# Patient Record
Sex: Female | Born: 1995 | Hispanic: Yes | Marital: Single | Smoking: Never smoker
Health system: Southern US, Community
[De-identification: ages and names within clinical notes are randomized; demographics above are authoritative.]

## PROBLEM LIST (undated history)

## (undated) ENCOUNTER — Inpatient Hospital Stay (HOSPITAL_COMMUNITY): Payer: Medicaid Other

## (undated) DIAGNOSIS — N6311 Unspecified lump in the right breast, upper outer quadrant: Secondary | ICD-10-CM

## (undated) DIAGNOSIS — N644 Mastodynia: Secondary | ICD-10-CM

## (undated) DIAGNOSIS — D649 Anemia, unspecified: Secondary | ICD-10-CM

## (undated) DIAGNOSIS — Z8619 Personal history of other infectious and parasitic diseases: Secondary | ICD-10-CM

## (undated) HISTORY — DX: Personal history of other infectious and parasitic diseases: Z86.19

---
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
## 2015-02-04 ENCOUNTER — Ambulatory Visit (INDEPENDENT_AMBULATORY_CARE_PROVIDER_SITE_OTHER): Payer: Medicaid Other | Admitting: Obstetrics & Gynecology

## 2015-02-10 ENCOUNTER — Ambulatory Visit (INDEPENDENT_AMBULATORY_CARE_PROVIDER_SITE_OTHER): Payer: Medicaid Other | Admitting: Obstetrics & Gynecology

## 2015-03-20 ENCOUNTER — Ambulatory Visit (INDEPENDENT_AMBULATORY_CARE_PROVIDER_SITE_OTHER): Payer: Medicaid Other | Admitting: Obstetrics & Gynecology

## 2015-03-20 ENCOUNTER — Encounter (INDEPENDENT_AMBULATORY_CARE_PROVIDER_SITE_OTHER): Payer: Self-pay | Admitting: Obstetrics & Gynecology

## 2015-03-20 VITALS — BP 122/73 | HR 93 | Resp 18 | Wt 146.8 lb

## 2015-03-20 DIAGNOSIS — Z309 Encounter for contraceptive management, unspecified: Secondary | ICD-10-CM | POA: Insufficient documentation

## 2015-03-20 DIAGNOSIS — N898 Other specified noninflammatory disorders of vagina: Secondary | ICD-10-CM | POA: Insufficient documentation

## 2015-03-20 DIAGNOSIS — Z3002 Counseling and instruction in natural family planning to avoid pregnancy: Secondary | ICD-10-CM

## 2015-03-20 LAB — POCT PREGNANCY TEST, URINE HCG: POCT Pregnancy HCG Test, UR: NEGATIVE

## 2015-03-20 NOTE — Progress Notes (Signed)
Subjective:       Sandra Burgess is a 19 y.o. female who presents for a postpartum visit. She is 8 months postpartum following a spontaneous vaginal delivery. I have fully reviewed the prenatal and intrapartum course. The delivery was at 39 gestational weeks. Outcome: spontaneous vaginal delivery. Anesthesia: epidural. Postpartum course has been normal. Baby's course has been normal. Baby is feeding by bottle - Enfamil AR. Bleeding no bleeding. Bowel function is normal. Bladder function is normal. Patient is not sexually active. Contraception method is abstinence. Postpartum depression screening: negative.    The following portions of the patient's history were reviewed and updated as appropriate: allergies, current medications, past family history, past medical history, past social history, past surgical history and problem list.    Review of Systems  A comprehensive review of systems was negative.     Objective:      BP 122/73 mmHg  Pulse 93  Resp 18  Wt 146 lb 12.8 oz (66.588 kg)  LMP 02/27/2015 (Exact Date)   General:  alert, appears stated age and cooperative    Breasts:  inspection negative, no nipple discharge or bleeding, no masses or nodularity palpable   Lungs: clear to auscultation bilaterally and normal percussion bilaterally   Heart:  regular rate and rhythm, S1, S2 normal, no murmur, click, rub or gallop and normal apical impulse   Abdomen: soft, non-tender; bowel sounds normal; no masses,  no organomegaly    Vulva:  normal   Vagina: normal vagina, no discharge, exudate, lesion, or erythema   Cervix:  no lesions   Corpus: normal size, contour, position, consistency, mobility, non-tender   Adnexa:  no mass, fullness, tenderness   Rectal Exam: Normal rectovaginal exam          Assessment:       8 months postpartum exam. deisres contraception counsel    Plan:   POSTPARTUM EXAM   1. Contraception: abstinence  2. No signs of postpartum depression, no hi no si  3. Follow up in: 3 weeks or as needed.       CONTRACPETION-- counseled over 20 m on all methods r/b/a discussed. Side effects  Pt desires LARC  Will rtc on mense  Probable nexplanon insertion    VAGINAL DISCHARGe-- sure swab done  Await results    Baruch Goldmann MD, Evern Core

## 2015-03-23 LAB — CT, NG, TRICH VAG BY NAA
CHLAMYDIA TRACHOMATIS, NAA: NEGATIVE
Gonococcus by Nucleic acid Amp: NEGATIVE
TRICH VAG BY NAA: NEGATIVE

## 2015-03-27 LAB — VAGINITIS/VAGINOSIS, DNA PROBE

## 2015-04-03 ENCOUNTER — Ambulatory Visit (INDEPENDENT_AMBULATORY_CARE_PROVIDER_SITE_OTHER): Payer: Medicaid Other | Admitting: Obstetrics & Gynecology

## 2015-04-14 ENCOUNTER — Encounter (INDEPENDENT_AMBULATORY_CARE_PROVIDER_SITE_OTHER): Payer: Self-pay | Admitting: Obstetrics & Gynecology

## 2015-04-14 ENCOUNTER — Ambulatory Visit (INDEPENDENT_AMBULATORY_CARE_PROVIDER_SITE_OTHER): Payer: Medicaid Other | Admitting: Obstetrics & Gynecology

## 2015-04-14 VITALS — BP 106/72 | HR 66 | Wt 145.6 lb

## 2015-04-14 DIAGNOSIS — Z30017 Encounter for initial prescription of implantable subdermal contraceptive: Secondary | ICD-10-CM

## 2015-04-14 DIAGNOSIS — Z308 Encounter for other contraceptive management: Secondary | ICD-10-CM

## 2015-04-14 NOTE — Progress Notes (Signed)
Nexplanon Insertion Procedure Note    Pre-operative Diagnosis: undesired fertility    Post-operative Diagnosis: same    Indications: contraception    Procedure Details   Urine pregnancy test was done 8/9 and result was neg.  The risks (including infection, bleeding, pain, and uterine perforation) and benefits of the procedure were explained to the patient and Written informed consent was obtained.      Left arm cleansed with Betadine.  1cc lidocaine given.  Nexplanon inserted without difficulty. Pressure for hemostasis. Patient tolerated procedure well.    Information:  nexplanon.    Condition:  Stable    Complications:  None    Plan:    The patient was advised to call for any fever or for prolonged or severe pain or bleeding. She was advised to use NSAID as needed for mild to moderate pain.     Attending Physician Documentation:  I was present for or participated in the entire procedure, including opening and closing.  Pt to rtc in 4 weeks  Card given  All side effects discussed    Jerene Canny MD, Evern Core

## 2015-04-23 ENCOUNTER — Encounter (INDEPENDENT_AMBULATORY_CARE_PROVIDER_SITE_OTHER): Payer: Self-pay

## 2015-05-12 ENCOUNTER — Ambulatory Visit (INDEPENDENT_AMBULATORY_CARE_PROVIDER_SITE_OTHER): Payer: Medicaid Other | Admitting: Obstetrics & Gynecology

## 2018-09-05 NOTE — L&D Delivery Note (Signed)
Obstetrical Delivery Note   Date of Delivery:   06/09/2019 Primary OB:   [facility] Gestational Age/EDD: [redacted]w[redacted]d Reason for Admission: Bulging membranes, cervical insufficiency Antepartum complications: none  Delivered By:   Durene Romans MD  Delivery Type:   spontaneous vaginal delivery  Delivery Details:   I went to assess Corrissa and BBOW out the introitus with previously established IUFD. Patient bore down and easily delivered fetus (morphologically normal appearing) en caul (normal appearing fluid). Anesthesia:    IV Intrapartum complications: patient presented to [facility] triage with sudden onset pelvic pressure and spotting and saw BOW Laceration:    none Episiotomy:    none Rectal exam:   deferred Placenta:    Retained. To OR for D&C (pt not bleeding) Estimated Blood Loss:  55mL  Baby:    Stillborn fetus, APGARs 0/0, weight appears appropriate. Morphologically normal fetus.   Durene Romans MD Attending [facility])

## 2019-02-12 ENCOUNTER — Ambulatory Visit (INDEPENDENT_AMBULATORY_CARE_PROVIDER_SITE_OTHER): Payer: Medicaid Other | Admitting: Obstetrics & Gynecology

## 2019-02-12 ENCOUNTER — Encounter: Payer: Self-pay | Admitting: *Deleted

## 2019-02-12 ENCOUNTER — Other Ambulatory Visit: Payer: Self-pay

## 2019-02-12 ENCOUNTER — Encounter: Payer: Self-pay | Admitting: Obstetrics & Gynecology

## 2019-02-12 VITALS — BP 121/71 | HR 87 | Ht 59.0 in | Wt 182.0 lb

## 2019-02-12 DIAGNOSIS — N63 Unspecified lump in unspecified breast: Secondary | ICD-10-CM

## 2019-02-12 DIAGNOSIS — Z3046 Encounter for surveillance of implantable subdermal contraceptive: Secondary | ICD-10-CM | POA: Diagnosis not present

## 2019-02-12 NOTE — Progress Notes (Signed)
Pt would like to get nexplanon removed and also has lump on right breast

## 2019-02-12 NOTE — Progress Notes (Signed)
   Subjective:    Patient ID: Theresa Fletcher, female    DOB: 10/29/1995, 23 y.o.   MRN: [id number]  HPI  23 yo single P19 (23 yo) here to have her 23 year old Nexplanon removed. She also would like a breast exam since she found a lump in her right breast about a week ago. It seems to be getting bigger. She has not found anything to make it better or worse.  She is planning to use condoms.  Review of Systems     Objective:   Physical Exam Breathing, conversing, and ambulating normally Well nourished, well hydrated Latina, no apparent distress  Normal breast exam on the left. I noted a 2 cm firm round lump in the right breast, upper outer quadrant about 2cm away from the nipple. No skin changes or nipple discharge. Consent was signed and time out was done. Her left arm was prepped with betadine after establishing the position of the Nexplanon. The area was infiltrated with 2 cc of 1% lidocaine. A small incision was made and the intact rod was easily removed and noted to be intact.  A steristrip was placed and her arm was noted to be hemostatic. It was bandaged.  She tolerated the procedure well.     Assessment & Plan:  Contraception- condoms prn Rec start MVI daily Come back 2 weeks after stopping caffeine If breast lump still there, then will need mammo and u/s

## 2019-02-25 ENCOUNTER — Ambulatory Visit (INDEPENDENT_AMBULATORY_CARE_PROVIDER_SITE_OTHER): Payer: Self-pay | Admitting: Obstetrics & Gynecology

## 2019-02-25 ENCOUNTER — Other Ambulatory Visit: Payer: Self-pay

## 2019-02-25 VITALS — BP 115/72 | HR 79 | Ht 59.0 in | Wt 185.0 lb

## 2019-02-25 DIAGNOSIS — N63 Unspecified lump in unspecified breast: Secondary | ICD-10-CM

## 2019-02-25 NOTE — Progress Notes (Signed)
Lump right breast.  Tender to touch.

## 2019-02-25 NOTE — Progress Notes (Signed)
   Subjective:    Patient ID: Theresa Fletcher, female    DOB: Apr 13, 1996, 23 y.o.   MRN: [id number]  HPI 23 yo single P27 (23 yo) here because the breast lump that I evaluated on 02/12/19 has not resolved. It is tender to the touch. She has stopped drinking caffeine.  Review of Systems     Objective:   Physical Exam Breathing, conversing, and ambulating normally   2 cm firm round lump in the right breast, upper outer quadrant about 2cm away from the nipple, somewhat tender, No skin changes or nipple discharge.      Assessment & Plan:  Persistent right breast mass- check diagnostic mammogram and breast u/s

## 2019-02-26 ENCOUNTER — Other Ambulatory Visit (HOSPITAL_COMMUNITY): Payer: Self-pay | Admitting: *Deleted

## 2019-02-26 ENCOUNTER — Telehealth (HOSPITAL_COMMUNITY): Payer: Self-pay

## 2019-02-26 DIAGNOSIS — N6310 Unspecified lump in the right breast, unspecified quadrant: Secondary | ICD-10-CM

## 2019-02-26 NOTE — Telephone Encounter (Signed)
Telephoned patient at home number left voice message. Voice message with [facility] phone numbers for both locations for her to return call.

## 2019-02-28 ENCOUNTER — Ambulatory Visit (HOSPITAL_COMMUNITY): Payer: Medicaid Other

## 2019-03-05 ENCOUNTER — Other Ambulatory Visit: Payer: Medicaid Other

## 2019-03-12 ENCOUNTER — Ambulatory Visit (HOSPITAL_COMMUNITY)
Admission: RE | Admit: 2019-03-12 | Discharge: 2019-03-12 | Disposition: A | Discharge status: Home / Self Care | Payer: Medicaid Other | Source: Ambulatory Visit | Attending: Obstetrics and Gynecology | Admitting: Obstetrics and Gynecology

## 2019-03-12 ENCOUNTER — Other Ambulatory Visit: Payer: Self-pay

## 2019-03-12 ENCOUNTER — Encounter (HOSPITAL_COMMUNITY): Payer: Self-pay

## 2019-03-12 ENCOUNTER — Ambulatory Visit
Admission: RE | Admit: 2019-03-12 | Discharge: 2019-03-12 | Disposition: A | Discharge status: Home / Self Care | Payer: No Typology Code available for payment source | Source: Ambulatory Visit | Attending: Obstetrics and Gynecology | Admitting: Obstetrics and Gynecology

## 2019-03-12 DIAGNOSIS — N644 Mastodynia: Secondary | ICD-10-CM

## 2019-03-12 DIAGNOSIS — Z1239 Encounter for other screening for malignant neoplasm of breast: Secondary | ICD-10-CM

## 2019-03-12 DIAGNOSIS — N6310 Unspecified lump in the right breast, unspecified quadrant: Secondary | ICD-10-CM

## 2019-03-12 DIAGNOSIS — N6311 Unspecified lump in the right breast, upper outer quadrant: Secondary | ICD-10-CM

## 2019-03-12 HISTORY — DX: Unspecified lump in the right breast, upper outer quadrant: N63.11

## 2019-03-12 HISTORY — DX: Mastodynia: N64.4

## 2019-03-12 NOTE — Progress Notes (Signed)
Complaints of right breast lump x 1.5 months that is painful. Patient states the pain comes and goes. Patient rates the pain at a 5 out of 10.  Pap Smear: Pap smear not completed today. Last Pap smear was in September 2018 in [place] and normal per patient. Per patient has no history of an abnormal Pap smear. No Pap smear results are in Epic.  Physical exam: Breasts Breasts symmetrical. No skin abnormalities bilateral breasts. No nipple retraction bilateral breasts. No nipple discharge bilateral breasts. No lymphadenopathy. No lumps palpated left breast. Palpated a lump within the right breast at 10 o'clock 5 cm from the nipple. Complaints of tenderness when palpated right breast lump. Referred patient to the [facility] for a right breat ultrasound. Appointment scheduled for Tuesday, March 12, 2019 at 1420.        Pelvic/Bimanual No Pap smear completed today since last Pap smear was in September 2018 per patient. Pap smear not indicated per B[facility]CP guidelines.   Smoking History: Patient has never smoked.  Patient Navigation: Patient education provided. Access to services provided for patient through [facility]CP program.   Breast and Cervical Cancer Risk Assessment: Patient has no family history of breast cancer, known genetic mutations, or radiation treatment to the chest before age 1. Patient has no history of cervical dysplasia, immunocompromised, or DES exposure in-utero. Breast cancer risk assessment completed. No breast cancer risk calculated due to patient is less than 82 years old.

## 2019-03-12 NOTE — Patient Instructions (Signed)
Explained breast self awareness with [name]. Patient did not need a Pap smear today due to last Pap smear was in September 2018 per patient. Let her know [facility] will cover Pap smears every 3 years unless has a history of abnormal Pap smears. Referred patient to the [facility] for a right breat ultrasound. Appointment scheduled for Tuesday, March 12, 2019 at 1420. Patient aware of appointment and will be there. Theresa Fletcher verbalized understanding.  Theresa Fletcher, Arvil Chaco, RN 12:49 PM

## 2019-03-14 ENCOUNTER — Encounter (HOSPITAL_COMMUNITY): Payer: Self-pay | Admitting: *Deleted

## 2019-04-04 ENCOUNTER — Other Ambulatory Visit: Payer: Self-pay

## 2019-04-04 ENCOUNTER — Encounter (HOSPITAL_COMMUNITY): Payer: Self-pay | Admitting: Emergency Medicine

## 2019-04-04 ENCOUNTER — Ambulatory Visit (HOSPITAL_COMMUNITY)
Admission: EM | Admit: 2019-04-04 | Discharge: 2019-04-04 | Disposition: A | Discharge status: Home / Self Care | Payer: No Typology Code available for payment source | Attending: Family Medicine | Admitting: Family Medicine

## 2019-04-04 DIAGNOSIS — W57XXXA Bitten or stung by nonvenomous insect and other nonvenomous arthropods, initial encounter: Secondary | ICD-10-CM

## 2019-04-04 DIAGNOSIS — S30861A Insect bite (nonvenomous) of abdominal wall, initial encounter: Secondary | ICD-10-CM

## 2019-04-04 DIAGNOSIS — T7840XA Allergy, unspecified, initial encounter: Secondary | ICD-10-CM

## 2019-04-04 DIAGNOSIS — Z3401 Encounter for supervision of normal first pregnancy, first trimester: Secondary | ICD-10-CM

## 2019-04-04 MED ORDER — DIPHENHYDRAMINE HCL 25 MG PO TABS: 25.0000 mg | ORAL_TABLET | Freq: Four times a day (QID) | ORAL | 0 refills | Status: DC | PRN

## 2019-04-04 MED ORDER — PREDNISONE 10 MG PO TABS: 20.0000 mg | ORAL_TABLET | Freq: Every day | ORAL | 0 refills | Status: AC

## 2019-04-04 NOTE — ED Triage Notes (Signed)
Pt has a rash to her abdomen and left leg that started a week ago.  Pt has been putting an OTC cream to it that has not helped.  She denies new sheets, clothes, shampoos, soaps, or lotions.

## 2019-04-04 NOTE — Discharge Instructions (Signed)
Call your OB/GYN to see what they recommend with the prednisone  Benadryl is safe in pregnancy you may continue taking this Follow up as needed for continued or worsening symptoms

## 2019-04-04 NOTE — ED Provider Notes (Signed)
[place]    CSN: [id number] Arrival date & time: 04/04/19  1039     History   Chief Complaint Chief Complaint  Patient presents with  . Rash    HPI Theresa Fletcher is a 23 y.o. female.   Is a 23 year old female the presents today with rash and possible allergic reaction.  The rash started approximate 1 week ago and has worsened.  The rash is located to her abdominal area, left leg, upper back.  The rash is very pruritic.  She has been using over-the-counter cream that has not helped at all.   Denies any fever, joint pain. Denies any recent changes in lotions, detergents, foods or other possible irritants. No recent travel. Nobody else at home has the rash. Patient has been outside but denies any contact with plants or insects. No new foods or medications.   ROS per HPI        History reviewed. No pertinent past medical history.  Patient Active Problem List   Diagnosis Date Noted  . Screening breast examination 03/12/2019  . Breast lump on right side at 10 o'clock position 03/12/2019  . Breast pain, right 03/12/2019    History reviewed. No pertinent surgical history.  OB History    Gravida  2   Para  1   Term  1   Preterm      AB      Living  1     SAB      TAB      Ectopic      Multiple      Live Births  1            Home Medications    Prior to Admission medications   Medication Sig Start Date End Date Taking? Authorizing Provider  diphenhydrAMINE (BENADRYL) 25 MG tablet Take 1 tablet (25 mg total) by mouth every 6 (six) hours as needed. 04/04/19   Loura Halt A, NP  predniSONE (DELTASONE) 10 MG tablet Take 2 tablets (20 mg total) by mouth daily for 5 days. 04/04/19 04/09/19  Orvan July, NP    Family History Family History  Problem Relation Age of Onset  . Diabetes Maternal Grandmother   . Hypertension Maternal Grandfather   . Healthy Mother   . Healthy Father     Social History Social History   Tobacco Use  .  Smoking status: Never Smoker  . Smokeless tobacco: Never Used  Substance Use Topics  . Alcohol use: Not Currently  . Drug use: Not Currently     Allergies   Patient has no known allergies.   Review of Systems Review of Systems   Physical Exam Triage Vital Signs ED Triage Vitals  Enc Vitals Group     BP 04/04/19 1113 117/67     Pulse Rate 04/04/19 1113 86     Resp 04/04/19 1113 16     Temp 04/04/19 1113 97.9 F (36.6 C)     Temp Source 04/04/19 1113 Temporal     SpO2 04/04/19 1113 97 %     Weight --      Height --      Head Circumference --      Peak Flow --      Pain Score 04/04/19 1115 0     Pain Loc --      Pain Edu? --      Excl. in [place]? --    No data found.  Updated Vital Signs BP 117/67 (BP  Location: Left Arm)   Pulse 86   Temp 97.9 F (36.6 C) (Temporal)   Resp 16   LMP 02/16/2019 (Exact Date)   SpO2 97%   Visual Acuity Right Eye Distance:   Left Eye Distance:   Bilateral Distance:    Right Eye Near:   Left Eye Near:    Bilateral Near:     Physical Exam Vitals signs and nursing note reviewed.  Constitutional:      Appearance: Normal appearance.  HENT:     Head: Normocephalic and atraumatic.     Nose: Nose normal.  Eyes:     Conjunctiva/sclera: Conjunctivae normal.  Neck:     Musculoskeletal: Normal range of motion.  Pulmonary:     Effort: Pulmonary effort is normal.  Musculoskeletal: Normal range of motion.  Skin:    General: Skin is warm and dry.     Findings: Rash present.     Comments: Multiple insect bites noted that appear to be mosquito bites with swelling, surrounding erythema. Entire abdomen is covered.   Neurological:     Mental Status: She is alert.  Psychiatric:        Mood and Affect: Mood normal.      UC Treatments / Results  Labs (all labs ordered are listed, but only abnormal results are displayed) Labs Reviewed - No data to display  EKG   Radiology No results found.  Procedures Procedures (including  critical care time)  Medications Ordered in UC Medications - No data to display  Initial Impression / Assessment and Plan / UC Course  I have reviewed the triage vital signs and the nursing notes.  Pertinent labs & imaging results that were available during my care of the patient were reviewed by me and considered in my medical decision making (see chart for details).     Most likely allergic reaction to insect bites, most likely mosquito bites.  Recommended taking Benadryl oral.  This is safe during pregnancy.  Prescribed prednisone over the next 5 days.  Recommended that she call her OB/GYN to get approval of this due to it not being recommended in pregnancy especially in the first trimester.  Patient understanding  and agreed  to plan. Final Clinical Impressions(s) / UC Diagnoses   Final diagnoses:  Allergic reaction, initial encounter     Discharge Instructions     Call your OB/GYN to see what they recommend with the prednisone  Benadryl is safe in pregnancy you may continue taking this Follow up as needed for continued or worsening symptoms     ED Prescriptions    Medication Sig Dispense Auth. Provider   predniSONE (DELTASONE) 10 MG tablet Take 2 tablets (20 mg total) by mouth daily for 5 days. 10 tablet Jocie Meroney A, NP   diphenhydrAMINE (BENADRYL) 25 MG tablet Take 1 tablet (25 mg total) by mouth every 6 (six) hours as needed. 30 tablet Dahlia ByesBast, Sears Oran A, NP     Controlled Substance Prescriptions [place] Controlled Substance Registry consulted? Not Applicable   Janace ArisBast, Donnah Levert A, NP 04/04/19 1244

## 2019-04-29 ENCOUNTER — Encounter (HOSPITAL_COMMUNITY): Payer: Self-pay | Admitting: *Deleted

## 2019-04-29 ENCOUNTER — Other Ambulatory Visit: Payer: Self-pay

## 2019-04-29 ENCOUNTER — Inpatient Hospital Stay (HOSPITAL_COMMUNITY)
Admission: EM | Admit: 2019-04-29 | Discharge: 2019-04-30 | Disposition: A | Discharge status: Home / Self Care | Payer: Medicaid Other | Attending: Obstetrics & Gynecology | Admitting: Obstetrics & Gynecology

## 2019-04-29 DIAGNOSIS — Z3A10 10 weeks gestation of pregnancy: Secondary | ICD-10-CM

## 2019-04-29 DIAGNOSIS — B9689 Other specified bacterial agents as the cause of diseases classified elsewhere: Secondary | ICD-10-CM | POA: Insufficient documentation

## 2019-04-29 DIAGNOSIS — Z6741 Type O blood, Rh negative: Secondary | ICD-10-CM | POA: Insufficient documentation

## 2019-04-29 DIAGNOSIS — O209 Hemorrhage in early pregnancy, unspecified: Secondary | ICD-10-CM | POA: Insufficient documentation

## 2019-04-29 DIAGNOSIS — O23591 Infection of other part of genital tract in pregnancy, first trimester: Secondary | ICD-10-CM | POA: Insufficient documentation

## 2019-04-29 DIAGNOSIS — O219 Vomiting of pregnancy, unspecified: Secondary | ICD-10-CM | POA: Insufficient documentation

## 2019-04-29 HISTORY — DX: Anemia, unspecified: D64.9

## 2019-04-29 LAB — URINALYSIS, ROUTINE W REFLEX MICROSCOPIC
Bilirubin Urine: NEGATIVE
Glucose, UA: NEGATIVE mg/dL
Hgb urine dipstick: NEGATIVE
Ketones, ur: NEGATIVE mg/dL
Leukocytes,Ua: NEGATIVE
Nitrite: NEGATIVE
Protein, ur: NEGATIVE mg/dL
Specific Gravity, Urine: 1.013 (ref 1.005–1.030)
pH: 8 (ref 5.0–8.0)

## 2019-04-29 NOTE — MAU Note (Addendum)
Pt reports to MAU c/o NV that has made her very weak and tired. Pt reports she has vomited 6-8 times in the last 24 hours. Pt reports some spotting today and states she passed one blood clot. Pt reports her stomach feels discomfort and empty.

## 2019-04-29 NOTE — MAU Note (Signed)
The FHT associated with this encounter is not from the patients care. The FHR monitor was picking up signals from the monitor and it was disconnected. Please disregard the tracing.

## 2019-04-29 NOTE — ED Notes (Signed)
Report given to Vicente Males in MAU and transport contacted to take patient over.

## 2019-04-30 ENCOUNTER — Inpatient Hospital Stay (HOSPITAL_COMMUNITY): Payer: Medicaid Other

## 2019-04-30 DIAGNOSIS — B9689 Other specified bacterial agents as the cause of diseases classified elsewhere: Secondary | ICD-10-CM | POA: Diagnosis not present

## 2019-04-30 DIAGNOSIS — O209 Hemorrhage in early pregnancy, unspecified: Secondary | ICD-10-CM

## 2019-04-30 DIAGNOSIS — Z3A10 10 weeks gestation of pregnancy: Secondary | ICD-10-CM

## 2019-04-30 DIAGNOSIS — R109 Unspecified abdominal pain: Secondary | ICD-10-CM | POA: Diagnosis present

## 2019-04-30 DIAGNOSIS — O219 Vomiting of pregnancy, unspecified: Secondary | ICD-10-CM | POA: Diagnosis not present

## 2019-04-30 DIAGNOSIS — Z6741 Type O blood, Rh negative: Secondary | ICD-10-CM | POA: Diagnosis not present

## 2019-04-30 DIAGNOSIS — O23591 Infection of other part of genital tract in pregnancy, first trimester: Secondary | ICD-10-CM | POA: Diagnosis not present

## 2019-04-30 LAB — WET PREP, GENITAL
Sperm: NONE SEEN
Trich, Wet Prep: NONE SEEN
Yeast Wet Prep HPF POC: NONE SEEN

## 2019-04-30 MED ORDER — ONDANSETRON HCL 4 MG PO TABS
4.0000 mg | ORAL_TABLET | Freq: Once | ORAL | Status: AC
  Administered 2019-04-30: 4 mg via ORAL
  Filled 2019-04-30: qty 1

## 2019-04-30 MED ORDER — ONDANSETRON HCL 4 MG PO TABS: 4.0000 mg | ORAL_TABLET | Freq: Four times a day (QID) | ORAL | 1 refills | Status: DC | PRN

## 2019-04-30 MED ORDER — RHO D IMMUNE GLOBULIN 1500 UNIT/2ML IJ SOSY
300.0000 ug | PREFILLED_SYRINGE | Freq: Once | INTRAMUSCULAR | Status: AC
  Administered 2019-04-30: 300 ug via INTRAMUSCULAR
  Filled 2019-04-30: qty 2

## 2019-04-30 MED ORDER — METRONIDAZOLE 0.75 % VA GEL: 1.0000 | Freq: Every day | VAGINAL | 0 refills | Status: DC

## 2019-04-30 NOTE — Discharge Instructions (Signed)
Bacterial Vaginosis  Bacterial vaginosis is a vaginal infection that occurs when the normal balance of bacteria in the vagina is disrupted. It results from an overgrowth of certain bacteria. This is the most common vaginal infection among women ages 15-44. Because bacterial vaginosis increases your risk for STIs (sexually transmitted infections), getting treated can help reduce your risk for chlamydia, gonorrhea, herpes, and HIV (human immunodeficiency virus). Treatment is also important for preventing complications in pregnant women, because this condition can cause an early (premature) delivery. What are the causes? This condition is caused by an increase in harmful bacteria that are normally present in small amounts in the vagina. However, the reason that the condition develops is not fully understood. What increases the risk? The following factors may make you more likely to develop this condition:  Having a new sexual partner or multiple sexual partners.  Having unprotected sex.  Douching.  Having an intrauterine device (IUD).  Smoking.  Drug and alcohol abuse.  Taking certain antibiotic medicines.  Being pregnant. You cannot get bacterial vaginosis from toilet seats, bedding, swimming pools, or contact with objects around you. What are the signs or symptoms? Symptoms of this condition include:  Grey or white vaginal discharge. The discharge can also be watery or foamy.  A fish-like odor with discharge, especially after sexual intercourse or during menstruation.  Itching in and around the vagina.  Burning or pain with urination. Some women with bacterial vaginosis have no signs or symptoms. How is this diagnosed? This condition is diagnosed based on:  Your medical history.  A physical exam of the vagina.  Testing a sample of vaginal fluid under a microscope to look for a large amount of bad bacteria or abnormal cells. Your health care provider may use a cotton swab or  a small wooden spatula to collect the sample. How is this treated? This condition is treated with antibiotics. These may be given as a pill, a vaginal cream, or a medicine that is put into the vagina (suppository). If the condition comes back after treatment, a second round of antibiotics may be needed. Follow these instructions at home: Medicines  Take over-the-counter and prescription medicines only as told by your health care provider.  Take or use your antibiotic as told by your health care provider. Do not stop taking or using the antibiotic even if you start to feel better. General instructions  If you have a female sexual partner, tell her that you have a vaginal infection. She should see her health care provider and be treated if she has symptoms. If you have a female sexual partner, he does not need treatment.  During treatment: ? Avoid sexual activity until you finish treatment. ? Do not douche. ? Avoid alcohol as directed by your health care provider. ? Avoid breastfeeding as directed by your health care provider.  Drink enough water and fluids to keep your urine clear or pale yellow.  Keep the area around your vagina and rectum clean. ? Wash the area daily with warm water. ? Wipe yourself from front to back after using the toilet.  Keep all follow-up visits as told by your health care provider. This is important. How is this prevented?  Do not douche.  Wash the outside of your vagina with warm water only.  Use protection when having sex. This includes latex condoms and dental dams.  Limit how many sexual partners you have. To help prevent bacterial vaginosis, it is best to have sex with just one partner (  monogamous).  Make sure you and your sexual partner are tested for STIs.  Wear cotton or cotton-lined underwear.  Avoid wearing tight pants and pantyhose, especially during summer.  Limit the amount of alcohol that you drink.  Do not use any products that contain  nicotine or tobacco, such as cigarettes and e-cigarettes. If you need help quitting, ask your health care provider.  Do not use illegal drugs. Where to find more information  Centers for Disease Control and Prevention: www.cdc.gov/std  American Sexual Health Association (ASHA): www.ashastd.org  U.S. Department of Health and Human Services, Office on Women's Health: www.womenshealth.gov/ or https://www.womenshealth.gov/a-z-topics/bacterial-vaginosis Contact a health care provider if:  Your symptoms do not improve, even after treatment.  You have more discharge or pain when urinating.  You have a fever.  You have pain in your abdomen.  You have pain during sex.  You have vaginal bleeding between periods. Summary  Bacterial vaginosis is a vaginal infection that occurs when the normal balance of bacteria in the vagina is disrupted.  Because bacterial vaginosis increases your risk for STIs (sexually transmitted infections), getting treated can help reduce your risk for chlamydia, gonorrhea, herpes, and HIV (human immunodeficiency virus). Treatment is also important for preventing complications in pregnant women, because the condition can cause an early (premature) delivery.  This condition is treated with antibiotic medicines. These may be given as a pill, a vaginal cream, or a medicine that is put into the vagina (suppository). This information is not intended to replace advice given to you by your health care provider. Make sure you discuss any questions you have with your health care provider. Document Released: 08/22/2005 Document Revised: 08/04/2017 Document Reviewed: 05/07/2016 Elsevier Patient Education  2020 Elsevier Inc.  

## 2019-04-30 NOTE — MAU Provider Note (Signed)
History     CSN: [id number]  Arrival date and time: 04/29/19 2226   First Provider Initiated Contact with Patient 04/30/19 0056      Chief Complaint  Patient presents with  . Abdominal Pain  . Vaginal Bleeding    Theresa Fletcher is a 23 y.o. G2P1001 at [redacted]w[redacted]d who will receive care at [facility] starting Monday Sept 1, 2020.  Theresa Fletcher presents today for dizziness and vomiting.  Theresa Fletcher states Theresa Fletcher has been experiencing this throughout the pregnancy and has not taken any medication for it.  Theresa Fletcher states this week Theresa Fletcher hasn't been able to keep anything down.  Patient reports Theresa Fletcher ate chicken soup around 6pm without keeping it down.  However, patient reports Theresa Fletcher is able to drink water without incident.  Theresa Fletcher reports some vaginal discharge and blood clots "every now and then."  Theresa Fletcher states Theresa Fletcher had one yesterday that was "really big" that fell into the toilet.  Theresa Fletcher reports cramping prior to passing the clot, but contributes it to "my stomach hurting." Theresa Fletcher reports continued spotting and states that Theresa Fletcher is wearing a pantyliner and has red blood while wiping.  Patient also reports that Theresa Fletcher continues to have abdominal pain, but continues to contribute it to a stomach ache.  However, Theresa Fletcher rates the pain a 6/10 and describes it as a "stomach irritation."  Theresa Fletcher endorses sexual activity in the last 3 days.       OB History    Gravida  2   Para  1   Term  1   Preterm      AB      Living  1     SAB      TAB      Ectopic      Multiple      Live Births  1           Past Medical History:  Diagnosis Date  . Anemia     Past Surgical History:  Procedure Laterality Date  . NO PAST SURGERIES      Family History  Problem Relation Age of Onset  . Diabetes Maternal Grandmother   . Hypertension Maternal Grandfather   . Healthy Mother   . Healthy Father     Social History   Tobacco Use  . Smoking status: Never Smoker  . Smokeless tobacco: Never Used  Substance Use Topics  . Alcohol use:  Not Currently  . Drug use: Not Currently    Allergies: No Known Allergies  Medications Prior to Admission  Medication Sig Dispense Refill Last Dose  . Prenatal Vit-Fe Fumarate-FA (MULTIVITAMIN-PRENATAL) 27-0.8 MG TABS tablet Take 1 tablet by mouth daily at 12 noon.   04/29/2019 at Unknown time  . diphenhydrAMINE (BENADRYL) 25 MG tablet Take 1 tablet (25 mg total) by mouth every 6 (six) hours as needed. 30 tablet 0 Unknown at Unknown time    Review of Systems  Constitutional: Negative for chills and fever.  Respiratory: Negative for cough and shortness of breath.   Gastrointestinal: Positive for abdominal pain (Contributes to stomachache), constipation (Yesterday; Hard to pass), nausea and vomiting. Negative for diarrhea.  Genitourinary: Positive for vaginal bleeding and vaginal discharge. Negative for difficulty urinating and dysuria.  Musculoskeletal: Positive for back pain.  Neurological: Positive for dizziness, light-headedness and headaches.   Physical Exam   Blood pressure 119/81, pulse 87, temperature 98 F (36.7 C), temperature source Oral, resp. rate 18, last menstrual period 02/16/2019, SpO2 98 %.  Physical Exam  Constitutional: Theresa Fletcher  is oriented to person, place, and time. Theresa Fletcher appears well-developed and well-nourished.  HENT:  Head: Normocephalic and atraumatic.  Eyes: Conjunctivae are normal.  Neck: Normal range of motion.  Cardiovascular: Normal rate and normal heart sounds.  Respiratory: Effort normal and breath sounds normal.  GI: Soft.  Genitourinary: Cervix exhibits discharge. Cervix exhibits no motion tenderness.    Vaginal discharge present.     Genitourinary Comments:  Speculum Exam: -Normal External Genitalia: Non tender, no apparent discharge at introitus.  -Vaginal Vault: Pink mucosa with good rugae. Moderate amt light brownish discharge in vault -wet prep collected -Cervix:Pink, no lesions, cysts, or polyps.  Appears closed. No active bleeding, but blood  noted at os-GC/CT collected -Bimanual Exam:  Closed, No Tenderness. Uterus difficult to assess d/t body habitus.     Musculoskeletal: Normal range of motion.  Neurological: Theresa Fletcher is alert and oriented to person, place, and time.  Skin: Skin is warm and dry.  Psychiatric: Theresa Fletcher has a normal mood and affect. Her behavior is normal.    MAU Course  Procedures Results for orders placed or performed during the hospital encounter of 04/29/19 (from the past 24 hour(s))  Urinalysis, Routine w reflex microscopic     Status: Abnormal   Collection Time: 04/29/19 11:08 PM  Result Value Ref Range   Color, Urine YELLOW YELLOW   APPearance HAZY (A) CLEAR   Specific Gravity, Urine 1.013 1.005 - 1.030   pH 8.0 5.0 - 8.0   Glucose, UA NEGATIVE NEGATIVE mg/dL   Hgb urine dipstick NEGATIVE NEGATIVE   Bilirubin Urine NEGATIVE NEGATIVE   Ketones, ur NEGATIVE NEGATIVE mg/dL   Protein, ur NEGATIVE NEGATIVE mg/dL   Nitrite NEGATIVE NEGATIVE   Leukocytes,Ua NEGATIVE NEGATIVE  Wet prep, genital     Status: Abnormal   Collection Time: 04/30/19  1:07 AM  Result Value Ref Range   Yeast Wet Prep HPF POC NONE SEEN NONE SEEN   Trich, Wet Prep NONE SEEN NONE SEEN   Clue Cells Wet Prep HPF POC PRESENT (A) NONE SEEN   WBC, Wet Prep HPF POC MANY (A) NONE SEEN   Sperm NONE SEEN   Rh IG workup (includes ABO/Rh)     Status: None (Preliminary result)   Collection Time: 04/30/19  1:17 AM  Result Value Ref Range   Gestational Age(Wks) 10.4    ABO/RH(D) O NEG    Antibody Screen NEG    Unit Number [id number]    Blood Component Type RHIG    Unit division 00    Status of Unit ISSUED    Transfusion Status      OK TO TRANSFUSE Performed at [facility], [address] [place], [place] [address]    MDM Physical Exam Labs: UA, Wet Prep, GC/CT, T&S Ultrasound Assessment and Plan  23 year old  G2P1001 Vaginal Bleeding Nausea/Vomiting  -Exam findings discussed. -Patient questions if Theresa Fletcher is having a  miscarriage and informed that more testing is necessary.  -Cultures collected and sent. -Labs ordered. -Offered and patient declined pain medication.  -Will give Zofran for nausea.  -Will send for US and reassess.   Cherre RobinsJessica L Teri Legacy MSN, CNM 04/30/2019, 12:56 AM   Reassessment (2:54 AM) Bacterial Vaginosis IUP at 9.4 weeks O Negative  -Wet prep returns significant for clue cells -Results discussed with patient -Rx for Metrogel 0.75% PV QHS x 5days sent to pharmacy on file.  -US results discussed and informed no change would be made in LMP considering that not greater than  7 day difference.  -Rhogam ordered and given. -Patient reports improvement of nausea with Zofran. -Rx for 4mg  tablets sent to pharmacy on file.  -Bleeding Precautions given. -Encouraged to call or return to MAU if symptoms worsen or with the onset of new symptoms. -Discharged to home in stable condition.  Maryann Conners MSN, CNM 04/30/2019

## 2019-05-01 LAB — RH IG WORKUP (INCLUDES ABO/RH)
ABO/RH(D): O NEG
Antibody Screen: NEGATIVE
Gestational Age(Wks): 10.4
Unit division: 0

## 2019-05-01 LAB — GC/CHLAMYDIA PROBE AMP (~~LOC~~) NOT AT ARMC
Chlamydia: NEGATIVE
Neisseria Gonorrhea: NEGATIVE

## 2019-05-06 ENCOUNTER — Encounter: Payer: Self-pay | Admitting: *Deleted

## 2019-05-06 ENCOUNTER — Ambulatory Visit (INDEPENDENT_AMBULATORY_CARE_PROVIDER_SITE_OTHER): Payer: Medicaid Other | Admitting: Obstetrics and Gynecology

## 2019-05-06 ENCOUNTER — Encounter: Payer: Self-pay | Admitting: Obstetrics and Gynecology

## 2019-05-06 ENCOUNTER — Other Ambulatory Visit: Payer: Self-pay

## 2019-05-06 VITALS — BP 111/73 | HR 90 | Wt 186.0 lb

## 2019-05-06 DIAGNOSIS — Z3A11 11 weeks gestation of pregnancy: Secondary | ICD-10-CM

## 2019-05-06 DIAGNOSIS — Z6791 Unspecified blood type, Rh negative: Secondary | ICD-10-CM

## 2019-05-06 DIAGNOSIS — O26891 Other specified pregnancy related conditions, first trimester: Secondary | ICD-10-CM | POA: Diagnosis not present

## 2019-05-06 DIAGNOSIS — O99210 Obesity complicating pregnancy, unspecified trimester: Secondary | ICD-10-CM

## 2019-05-06 DIAGNOSIS — Z862 Personal history of diseases of the blood and blood-forming organs and certain disorders involving the immune mechanism: Secondary | ICD-10-CM

## 2019-05-06 DIAGNOSIS — O99211 Obesity complicating pregnancy, first trimester: Secondary | ICD-10-CM

## 2019-05-06 DIAGNOSIS — O219 Vomiting of pregnancy, unspecified: Secondary | ICD-10-CM | POA: Insufficient documentation

## 2019-05-06 DIAGNOSIS — Z23 Encounter for immunization: Secondary | ICD-10-CM | POA: Diagnosis not present

## 2019-05-06 DIAGNOSIS — Z3401 Encounter for supervision of normal first pregnancy, first trimester: Secondary | ICD-10-CM | POA: Insufficient documentation

## 2019-05-06 DIAGNOSIS — E669 Obesity, unspecified: Secondary | ICD-10-CM | POA: Insufficient documentation

## 2019-05-06 DIAGNOSIS — O26899 Other specified pregnancy related conditions, unspecified trimester: Secondary | ICD-10-CM | POA: Insufficient documentation

## 2019-05-06 MED ORDER — POLYETHYLENE GLYCOL 3350 17 G PO PACK: 17.0000 g | PACK | Freq: Two times a day (BID) | ORAL | 1 refills | Status: DC | PRN

## 2019-05-06 MED ORDER — DOXYLAMINE-PYRIDOXINE 10-10 MG PO TBEC: DELAYED_RELEASE_TABLET | ORAL | 6 refills | Status: DC

## 2019-05-06 MED ORDER — BLOOD PRESSURE KIT: 1.0000 | PACK | 0 refills | Status: DC

## 2019-05-06 NOTE — Progress Notes (Signed)
Last pap was 2 years ago-WNL done at [facility]

## 2019-05-06 NOTE — Progress Notes (Signed)
New OB Note  05/06/2019   Clinic: [facility]  Chief Complaint: NOB  Transfer of Care Patient: no  History of Present Illness: Ms. Theresa Fletcher is a 23 y.o. G2P1001 @ 11/3 weeks (EDC 3/19, based on Patient's last menstrual period was 02/15/2019.=9wk u/s).  Preg complicated by has Encounter for supervision of normal first pregnancy in first trimester; Rh negative state in antepartum period; Obesity in pregnancy; BMI 30s; and Nausea and vomiting in pregnancy on their problem list.   Any events prior to today's visit: patient went to [facility] on [facility] for VB and pain and was given rhogam and started on zofran She has mild signs or symptoms of nausea/vomiting of pregnancy. She has Negative signs or symptoms of miscarriage or preterm labor On any medications around the time she conceived/early pregnancy: No   No VB. +constipation. Only needs to use zofran PRN.   ROS: A 12-point review of systems was performed and negative, except as stated in the above HPI.  OBGYN History: As per HPI. OB History  Gravida Para Term Preterm AB Living  2 1 1     1   SAB TAB Ectopic Multiple Live Births          1    # Outcome Date GA Lbr Len/2nd Weight Sex Delivery Anes PTL Lv  2 Current           1 Term 07/19/14   7 lb 1 oz (3.204 kg)  Vag-Spont   LIV    Any issues with any prior pregnancies: chronic n/v of pregnancy, anemia Prior children are healthy, doing well, and without any problems or issues: yes History of pap smears: Yes. Last pap smear 2018 and results were in [place]   Past Medical History: Past Medical History:  Diagnosis Date  . Anemia   . Breast lump on right side at 10 o'clock position 03/12/2019  . Breast pain, right 03/12/2019    Past Surgical History: Past Surgical History:  Procedure Laterality Date  . NO PAST SURGERIES      Family History:  Family History  Problem Relation Age of Onset  . Diabetes Maternal Grandmother   . Hypertension Maternal Grandfather   .  Healthy Mother   . Healthy Father     She denies any history of mental retardation, birth defects or genetic disorders in her or the FOB's history  Social History:  Social History   Socioeconomic History  . Marital status: Single    Spouse name: Not on file  . Number of children: 1  . Years of education: Not on file  . Highest education level: 12th grade  Occupational History  . Not on file  Social Needs  . Financial resource strain: Not on file  . Food insecurity    Worry: Not on file    Inability: Not on file  . Transportation needs    Medical: No    Non-medical: No  Tobacco Use  . Smoking status: Never Smoker  . Smokeless tobacco: Never Used  Substance and Sexual Activity  . Alcohol use: Not Currently  . Drug use: Not Currently  . Sexual activity: Yes    Birth control/protection: None  Lifestyle  . Physical activity    Days per week: Not on file    Minutes per session: Not on file  . Stress: Not on file  Relationships  . Social Musician on phone: Not on file    Gets together: Not on file  Attends religious service: Not on file    Active member of club or organization: Not on file    Attends meetings of clubs or organizations: Not on file    Relationship status: Not on file  . Intimate partner violence    Fear of current or ex partner: Not on file    Emotionally abused: Not on file    Physically abused: Not on file    Forced sexual activity: Not on file  Other Topics Concern  . Not on file  Social History Narrative  . Not on file    Allergy: No Known Allergies   Current Outpatient Medications: PNV, zofran PRN  Physical Exam:   BP 111/73   Pulse 90   Wt 186 lb (84.4 kg)   LMP 02/15/2019   BMI 37.57 kg/m  Body mass index is 37.57 kg/m. Contractions: Not present Vag. Bleeding: None. Fundal height: not applicable FHTs: 841L  General appearance: Well nourished, well developed female in no acute distress.  Neck:  Supple, normal  appearance, and no thyromegaly  Cardiovascular: S1, S2 normal, no murmur, rub or gallop, regular rate and rhythm Respiratory:  Clear to auscultation bilateral. Normal respiratory effort Abdomen: positive bowel sounds and no masses, hernias; diffusely non tender to palpation, non distended Breasts: patient denies any breast s/s. Neuro/Psych:  Normal mood and affect.  Skin:  Warm and dry.   Laboratory: none  Imaging:  CLINICAL DATA:  23 year old pregnant female with vaginal spotting. LMP: 02/15/2019 corresponding to an estimated gestational age of [redacted] weeks, 4 days.  EXAM: OBSTETRIC <14 WK [place] AND TRANSVAGINAL OB US  TECHNIQUE: Both transabdominal and transvaginal ultrasound examinations were performed for complete evaluation of the gestation as well as the maternal uterus, adnexal regions, and pelvic cul-de-sac. Transvaginal technique was performed to assess early pregnancy.  COMPARISON:  None.  FINDINGS: Intrauterine gestational sac: Single intrauterine gestational sac.  Yolk sac:  Seen  Embryo:  Present  Cardiac Activity: Detected  Heart Rate: 153 bpm  CRL:  28 mm   9 w   4 d                  [place] EDC: 11/29/2019  Subchorionic hemorrhage:  None visualized.  Maternal uterus/adnexae: The maternal ovaries are unremarkable.  IMPRESSION: Single live intrauterine pregnancy with an estimated gestational age of [redacted] weeks, 4 days.   Electronically Signed   By: Anner Crete M.D.   On: 04/30/2019 02:06  Assessment: pt doing well  Plan: 1. Encounter for supervision of normal first pregnancy in first trimester Routine care.  - Babyscripts Schedule Optimization - Genetic Screening - Enroll Patient in Babyscripts - [place] MFM OB COMP + 14 WK; Future - Obstetric Panel, Including HIV - Culture, OB Urine - Hemoglobin A1c - CMP and Liver - Protein / creatinine ratio, urine - Comprehensive metabolic panel  2. Obesity in pregnancy Goals d/w pt - Hemoglobin  A1c - CMP and Liver - Protein / creatinine ratio, urine - Comprehensive metabolic panel  3. History of anemia - Ferritin  4. Rh negative state in antepartum period Rhogam later in pregnancy  5. Nausea and vomiting in pregnancy Pt amenable to diclegis trial   Problem list reviewed and updated.  Follow up in 8 weeks.  The nature of [place] with multiple MDs and other Advanced Practice Providers was explained to patient; also emphasized that residents, students are part of our team.  >50% of 20 min visit spent on  counseling and coordination of care.     Cornelia Copaharlie Alie Hardgrove, Jr. MD Attending Center for [facility](Faculty Practice)

## 2019-05-08 LAB — COMPREHENSIVE METABOLIC PANEL
Albumin/Globulin Ratio: 1.2 (ref 1.2–2.2)
BUN/Creatinine Ratio: 12 (ref 9–23)
Globulin, Total: 3 g/dL (ref 1.5–4.5)

## 2019-05-08 LAB — CMP AND LIVER
ALT: 12 IU/L (ref 0–32)
AST: 12 IU/L (ref 0–40)
Albumin: 3.7 g/dL — ABNORMAL LOW (ref 3.9–5.0)
Alkaline Phosphatase: 40 IU/L (ref 39–117)
BUN: 6 mg/dL (ref 6–20)
Bilirubin Total: 0.2 mg/dL (ref 0.0–1.2)
Bilirubin, Direct: 0.07 mg/dL (ref 0.00–0.40)
CO2: 22 mmol/L (ref 20–29)
Calcium: 8.8 mg/dL (ref 8.7–10.2)
Chloride: 101 mmol/L (ref 96–106)
Creatinine, Ser: 0.52 mg/dL — ABNORMAL LOW (ref 0.57–1.00)
GFR calc Af Amer: 156 mL/min/{1.73_m2} (ref >59)
GFR calc non Af Amer: 135 mL/min/{1.73_m2} (ref >59)
Glucose: 81 mg/dL (ref 65–99)
Potassium: 4 mmol/L (ref 3.5–5.2)
Sodium: 135 mmol/L (ref 134–144)
Total Protein: 6.7 g/dL (ref 6.0–8.5)

## 2019-05-08 LAB — URINE CULTURE, OB REFLEX: Organism ID, Bacteria: NO GROWTH

## 2019-05-08 LAB — OBSTETRIC PANEL, INCLUDING HIV
Basophils Absolute: 0 10*3/uL (ref 0.0–0.2)
Basos: 0 %
EOS (ABSOLUTE): 0.1 10*3/uL (ref 0.0–0.4)
Eos: 1 %
HIV Screen 4th Generation wRfx: NONREACTIVE
Hematocrit: 35.7 % (ref 34.0–46.6)
Hemoglobin: 12.3 g/dL (ref 11.1–15.9)
Hepatitis B Surface Ag: NEGATIVE
Immature Grans (Abs): 0 10*3/uL (ref 0.0–0.1)
Immature Granulocytes: 1 %
Lymphocytes Absolute: 1.7 10*3/uL (ref 0.7–3.1)
Lymphs: 20 %
MCH: 31.9 pg (ref 26.6–33.0)
MCHC: 34.5 g/dL (ref 31.5–35.7)
MCV: 93 fL (ref 79–97)
Monocytes Absolute: 0.6 10*3/uL (ref 0.1–0.9)
Monocytes: 7 %
Neutrophils Absolute: 6.2 10*3/uL (ref 1.4–7.0)
Neutrophils: 71 %
Platelets: 269 10*3/uL (ref 150–450)
RBC: 3.86 x10E6/uL (ref 3.77–5.28)
RDW: 12.4 % (ref 11.7–15.4)
RPR Ser Ql: NONREACTIVE
Rh Factor: NEGATIVE
Rubella Antibodies, IGG: 2.56 index (ref >0.99)
WBC: 8.6 10*3/uL (ref 3.4–10.8)

## 2019-05-08 LAB — AB SCR+ANTIBODY ID: Antibody Screen: POSITIVE — AB

## 2019-05-08 LAB — HEMOGLOBIN A1C
Est. average glucose Bld gHb Est-mCnc: 105 mg/dL
Hgb A1c MFr Bld: 5.3 % (ref 4.8–5.6)

## 2019-05-08 LAB — CULTURE, OB URINE

## 2019-05-08 LAB — FERRITIN: Ferritin: 93 ng/mL (ref 15–150)

## 2019-05-08 LAB — PROTEIN / CREATININE RATIO, URINE
Creatinine, Urine: 178.7 mg/dL
Protein, Ur: 13.5 mg/dL
Protein/Creat Ratio: 76 mg/g creat (ref 0–200)

## 2019-05-14 ENCOUNTER — Telehealth: Payer: Self-pay | Admitting: Radiology

## 2019-05-14 ENCOUNTER — Encounter: Payer: Self-pay | Admitting: Radiology

## 2019-05-14 NOTE — Telephone Encounter (Signed)
Patient informed of Horizon results  

## 2019-05-14 NOTE — Telephone Encounter (Signed)
Patient informed of Panorama Results

## 2019-05-28 ENCOUNTER — Encounter: Payer: Self-pay | Admitting: Radiology

## 2019-06-09 ENCOUNTER — Encounter (HOSPITAL_COMMUNITY)
Admission: AD | Disposition: A | Discharge status: Home / Self Care | Payer: Self-pay | Source: Home / Self Care | Attending: Obstetrics and Gynecology

## 2019-06-09 ENCOUNTER — Encounter (HOSPITAL_COMMUNITY): Payer: Self-pay

## 2019-06-09 ENCOUNTER — Inpatient Hospital Stay (HOSPITAL_COMMUNITY): Payer: Medicaid Other

## 2019-06-09 ENCOUNTER — Inpatient Hospital Stay (HOSPITAL_COMMUNITY)
Admission: AD | Admit: 2019-06-09 | Discharge: 2019-06-09 | Disposition: A | Discharge status: Home / Self Care | Payer: Medicaid Other | Attending: Obstetrics and Gynecology | Admitting: Obstetrics and Gynecology

## 2019-06-09 ENCOUNTER — Inpatient Hospital Stay (HOSPITAL_COMMUNITY): Payer: Medicaid Other | Admitting: Anesthesiology

## 2019-06-09 ENCOUNTER — Other Ambulatory Visit: Payer: Self-pay

## 2019-06-09 ENCOUNTER — Inpatient Hospital Stay (HOSPITAL_BASED_OUTPATIENT_CLINIC_OR_DEPARTMENT_OTHER): Payer: Medicaid Other

## 2019-06-09 DIAGNOSIS — Z6791 Unspecified blood type, Rh negative: Secondary | ICD-10-CM | POA: Diagnosis not present

## 2019-06-09 DIAGNOSIS — O021 Missed abortion: Secondary | ICD-10-CM | POA: Diagnosis present

## 2019-06-09 DIAGNOSIS — O200 Threatened abortion: Secondary | ICD-10-CM

## 2019-06-09 DIAGNOSIS — O26892 Other specified pregnancy related conditions, second trimester: Secondary | ICD-10-CM

## 2019-06-09 DIAGNOSIS — O034 Incomplete spontaneous abortion without complication: Secondary | ICD-10-CM | POA: Diagnosis not present

## 2019-06-09 DIAGNOSIS — Z20828 Contact with and (suspected) exposure to other viral communicable diseases: Secondary | ICD-10-CM | POA: Diagnosis not present

## 2019-06-09 DIAGNOSIS — Z9889 Other specified postprocedural states: Secondary | ICD-10-CM

## 2019-06-09 DIAGNOSIS — O3430 Maternal care for cervical incompetence, unspecified trimester: Secondary | ICD-10-CM | POA: Diagnosis present

## 2019-06-09 DIAGNOSIS — R109 Unspecified abdominal pain: Secondary | ICD-10-CM | POA: Diagnosis present

## 2019-06-09 DIAGNOSIS — Z3401 Encounter for supervision of normal first pregnancy, first trimester: Secondary | ICD-10-CM

## 2019-06-09 HISTORY — PX: DILATION AND EVACUATION: SHX1459

## 2019-06-09 LAB — SARS CORONAVIRUS 2 BY RT PCR (HOSPITAL ORDER, PERFORMED IN ~~LOC~~ HOSPITAL LAB): SARS Coronavirus 2: NEGATIVE

## 2019-06-09 LAB — CBC WITH DIFFERENTIAL/PLATELET
Abs Immature Granulocytes: 0.06 10*3/uL (ref 0.00–0.07)
Basophils Absolute: 0 10*3/uL (ref 0.0–0.1)
Basophils Relative: 0 %
Eosinophils Absolute: 0.1 10*3/uL (ref 0.0–0.5)
Eosinophils Relative: 1 %
HCT: 34.4 % — ABNORMAL LOW (ref 36.0–46.0)
Hemoglobin: 11.8 g/dL — ABNORMAL LOW (ref 12.0–15.0)
Immature Granulocytes: 1 %
Lymphocytes Relative: 26 %
Lymphs Abs: 2.5 10*3/uL (ref 0.7–4.0)
MCH: 32.2 pg (ref 26.0–34.0)
MCHC: 34.3 g/dL (ref 30.0–36.0)
MCV: 93.7 fL (ref 80.0–100.0)
Monocytes Absolute: 0.7 10*3/uL (ref 0.1–1.0)
Monocytes Relative: 7 %
Neutro Abs: 6.2 10*3/uL (ref 1.7–7.7)
Neutrophils Relative %: 65 %
Platelets: 235 10*3/uL (ref 150–400)
RBC: 3.67 MIL/uL — ABNORMAL LOW (ref 3.87–5.11)
RDW: 12.8 % (ref 11.5–15.5)
WBC: 9.6 10*3/uL (ref 4.0–10.5)
nRBC: 0 % (ref 0.0–0.2)

## 2019-06-09 SURGERY — DILATION AND EVACUATION, UTERUS
Anesthesia: General | Site: Uterus

## 2019-06-09 MED ORDER — FENTANYL CITRATE (PF) 100 MCG/2ML IJ SOLN
25.0000 ug | INTRAMUSCULAR | Status: DC | PRN
  Administered 2019-06-09: 25 ug via INTRAVENOUS

## 2019-06-09 MED ORDER — ONDANSETRON HCL 4 MG/2ML IJ SOLN
4.0000 mg | Freq: Once | INTRAMUSCULAR | Status: AC | PRN
  Administered 2019-06-09: 4 mg via INTRAVENOUS

## 2019-06-09 MED ORDER — ACETAMINOPHEN 10 MG/ML IV SOLN
1000.0000 mg | Freq: Once | INTRAVENOUS | Status: DC | PRN
  Administered 2019-06-09: 1000 mg via INTRAVENOUS

## 2019-06-09 MED ORDER — DEXAMETHASONE SODIUM PHOSPHATE 10 MG/ML IJ SOLN
INTRAMUSCULAR | Status: AC
  Filled 2019-06-09: qty 1

## 2019-06-09 MED ORDER — ACETAMINOPHEN 10 MG/ML IV SOLN
INTRAVENOUS | Status: AC
  Filled 2019-06-09: qty 100

## 2019-06-09 MED ORDER — METHYLERGONOVINE MALEATE 0.2 MG/ML IJ SOLN
INTRAMUSCULAR | Status: DC | PRN
  Administered 2019-06-09: 0.2 mg via INTRAMUSCULAR

## 2019-06-09 MED ORDER — SODIUM CHLORIDE 0.9 % IV SOLN
100.0000 mg | Freq: Once | INTRAVENOUS | Status: AC
  Administered 2019-06-09: 20:00:00 100 mg via INTRAVENOUS
  Filled 2019-06-09: qty 100

## 2019-06-09 MED ORDER — STERILE WATER FOR IRRIGATION IR SOLN
Status: DC | PRN
  Administered 2019-06-09: 1000 mL

## 2019-06-09 MED ORDER — DEXAMETHASONE SODIUM PHOSPHATE 10 MG/ML IJ SOLN
INTRAMUSCULAR | Status: DC | PRN
  Administered 2019-06-09: 10 mg via INTRAVENOUS

## 2019-06-09 MED ORDER — OXYCODONE-ACETAMINOPHEN 5-325 MG PO TABS: 1.0000 | ORAL_TABLET | Freq: Four times a day (QID) | ORAL | 0 refills | Status: DC | PRN

## 2019-06-09 MED ORDER — MISOPROSTOL 200 MCG PO TABS: 800.0000 ug | ORAL_TABLET | Freq: Once | ORAL | Status: DC | PRN

## 2019-06-09 MED ORDER — LIDOCAINE HCL (PF) 1 % IJ SOLN
INTRAMUSCULAR | Status: AC
  Filled 2019-06-09: qty 5

## 2019-06-09 MED ORDER — LIDOCAINE HCL 1 % IJ SOLN
INTRAMUSCULAR | Status: AC
  Filled 2019-06-09: qty 20

## 2019-06-09 MED ORDER — MEPERIDINE HCL 25 MG/ML IJ SOLN: 6.2500 mg | INTRAMUSCULAR | Status: DC | PRN

## 2019-06-09 MED ORDER — SCOPOLAMINE 1 MG/3DAYS TD PT72
MEDICATED_PATCH | TRANSDERMAL | Status: AC
  Filled 2019-06-09: qty 1

## 2019-06-09 MED ORDER — PHENYLEPHRINE HCL (PRESSORS) 10 MG/ML IV SOLN
INTRAVENOUS | Status: DC | PRN
  Administered 2019-06-09 (×3): 80 ug via INTRAVENOUS

## 2019-06-09 MED ORDER — OXYTOCIN 40 UNITS IN NORMAL SALINE INFUSION - SIMPLE MED: 2.5000 [IU]/h | INTRAVENOUS | Status: DC

## 2019-06-09 MED ORDER — OXYCODONE HCL 5 MG PO TABS: 5.0000 mg | ORAL_TABLET | Freq: Once | ORAL | Status: DC | PRN

## 2019-06-09 MED ORDER — LACTATED RINGERS IV SOLN
INTRAVENOUS | Status: DC
  Administered 2019-06-09 (×2): via INTRAVENOUS

## 2019-06-09 MED ORDER — POLYETHYLENE GLYCOL 3350 17 G PO PACK: 17.0000 g | PACK | Freq: Two times a day (BID) | ORAL | 0 refills | Status: DC | PRN

## 2019-06-09 MED ORDER — IBUPROFEN 200 MG PO TABS: 200.0000 mg | ORAL_TABLET | Freq: Four times a day (QID) | ORAL | Status: DC | PRN

## 2019-06-09 MED ORDER — RHO D IMMUNE GLOBULIN 1500 UNIT/2ML IJ SOSY
300.0000 ug | PREFILLED_SYRINGE | Freq: Once | INTRAMUSCULAR | Status: AC
  Administered 2019-06-09: 300 ug via INTRAVENOUS
  Filled 2019-06-09: qty 2

## 2019-06-09 MED ORDER — METHYLERGONOVINE MALEATE 0.2 MG/ML IJ SOLN
INTRAMUSCULAR | Status: AC
  Filled 2019-06-09: qty 1

## 2019-06-09 MED ORDER — FENTANYL CITRATE (PF) 100 MCG/2ML IJ SOLN
INTRAMUSCULAR | Status: AC
  Filled 2019-06-09: qty 2

## 2019-06-09 MED ORDER — PROPOFOL 10 MG/ML IV BOLUS
INTRAVENOUS | Status: AC
  Filled 2019-06-09: qty 20

## 2019-06-09 MED ORDER — PHENYLEPHRINE 40 MCG/ML (10ML) SYRINGE FOR IV PUSH (FOR BLOOD PRESSURE SUPPORT)
PREFILLED_SYRINGE | INTRAVENOUS | Status: AC
  Filled 2019-06-09: qty 10

## 2019-06-09 MED ORDER — LIDOCAINE HCL (CARDIAC) PF 100 MG/5ML IV SOSY
PREFILLED_SYRINGE | INTRAVENOUS | Status: DC | PRN
  Administered 2019-06-09: 100 mg via INTRAVENOUS

## 2019-06-09 MED ORDER — FENTANYL CITRATE (PF) 100 MCG/2ML IJ SOLN
INTRAMUSCULAR | Status: DC | PRN
  Administered 2019-06-09: 50 ug via INTRAVENOUS
  Administered 2019-06-09: 100 ug via INTRAVENOUS
  Administered 2019-06-09: 50 ug via INTRAVENOUS

## 2019-06-09 MED ORDER — ONDANSETRON HCL 4 MG/2ML IJ SOLN
INTRAMUSCULAR | Status: AC
  Filled 2019-06-09: qty 2

## 2019-06-09 MED ORDER — OXYTOCIN BOLUS FROM INFUSION: 500.0000 mL | Freq: Once | INTRAVENOUS | Status: DC

## 2019-06-09 MED ORDER — IBUPROFEN 600 MG PO TABS: 600.0000 mg | ORAL_TABLET | Freq: Four times a day (QID) | ORAL | 0 refills | Status: DC | PRN

## 2019-06-09 MED ORDER — OXYTOCIN 40 UNITS IN NORMAL SALINE INFUSION - SIMPLE MED
INTRAVENOUS | Status: AC
  Filled 2019-06-09: qty 1000

## 2019-06-09 MED ORDER — FAMOTIDINE IN NACL 20-0.9 MG/50ML-% IV SOLN
20.0000 mg | Freq: Once | INTRAVENOUS | Status: AC
  Administered 2019-06-09: 20 mg via INTRAVENOUS
  Filled 2019-06-09: qty 50

## 2019-06-09 MED ORDER — SCOPOLAMINE 1 MG/3DAYS TD PT72
MEDICATED_PATCH | TRANSDERMAL | Status: DC | PRN
  Administered 2019-06-09: 1 via TRANSDERMAL

## 2019-06-09 MED ORDER — OXYCODONE HCL 5 MG/5ML PO SOLN: 5.0000 mg | Freq: Once | ORAL | Status: DC | PRN

## 2019-06-09 MED ORDER — MIDAZOLAM HCL 2 MG/2ML IJ SOLN
INTRAMUSCULAR | Status: DC | PRN
  Administered 2019-06-09: 2 mg via INTRAVENOUS

## 2019-06-09 MED ORDER — ONDANSETRON HCL 4 MG/2ML IJ SOLN
INTRAMUSCULAR | Status: DC | PRN
  Administered 2019-06-09: 4 mg via INTRAVENOUS

## 2019-06-09 MED ORDER — SOD CITRATE-CITRIC ACID 500-334 MG/5ML PO SOLN
30.0000 mL | Freq: Once | ORAL | Status: AC
  Administered 2019-06-09: 30 mL via ORAL
  Filled 2019-06-09: qty 30

## 2019-06-09 MED ORDER — LIDOCAINE 2% (20 MG/ML) 5 ML SYRINGE
INTRAMUSCULAR | Status: AC
  Filled 2019-06-09: qty 5

## 2019-06-09 MED ORDER — LIDOCAINE HCL 1 % IJ SOLN
INTRAMUSCULAR | Status: DC | PRN
  Administered 2019-06-09: 20 mL

## 2019-06-09 MED ORDER — IBUPROFEN 100 MG/5ML PO SUSP: 200.0000 mg | Freq: Four times a day (QID) | ORAL | Status: DC | PRN

## 2019-06-09 MED ORDER — PROPOFOL 10 MG/ML IV BOLUS
INTRAVENOUS | Status: DC | PRN
  Administered 2019-06-09: 200 mg via INTRAVENOUS

## 2019-06-09 MED ORDER — MISOPROSTOL 200 MCG PO TABS
ORAL_TABLET | ORAL | Status: AC
  Filled 2019-06-09: qty 4

## 2019-06-09 MED ORDER — FENTANYL CITRATE (PF) 100 MCG/2ML IJ SOLN
100.0000 ug | INTRAMUSCULAR | Status: DC | PRN
  Administered 2019-06-09: 100 ug via INTRAVENOUS
  Filled 2019-06-09: qty 2

## 2019-06-09 MED ORDER — MIDAZOLAM HCL 2 MG/2ML IJ SOLN
INTRAMUSCULAR | Status: AC
  Filled 2019-06-09: qty 2

## 2019-06-09 SURGICAL SUPPLY — 20 items
CATH ROBINSON RED A/P 16FR (CATHETERS) ×3 IMPLANT
CLOTH BEACON ORANGE TIMEOUT ST (SAFETY) ×3 IMPLANT
DECANTER SPIKE VIAL GLASS SM (MISCELLANEOUS) ×3 IMPLANT
GLOVE BIO SURGEON STRL SZ7 (GLOVE) ×3 IMPLANT
GLOVE BIOGEL PI IND STRL 7.0 (GLOVE) ×1 IMPLANT
GLOVE BIOGEL PI INDICATOR 7.0 (GLOVE) ×2
GOWN STRL REUS W/TWL LRG LVL3 (GOWN DISPOSABLE) ×6 IMPLANT
KIT BERKELEY 1ST TRIMESTER 3/8 (MISCELLANEOUS) ×3 IMPLANT
NS IRRIG 1000ML POUR BTL (IV SOLUTION) ×3 IMPLANT
PACK VAGINAL MINOR WOMEN LF (CUSTOM PROCEDURE TRAY) ×3 IMPLANT
PAD OB MATERNITY 4.3X12.25 (PERSONAL CARE ITEMS) ×3 IMPLANT
PAD PREP 24X48 CUFFED NSTRL (MISCELLANEOUS) ×3 IMPLANT
SET BERKELEY SUCTION TUBING (SUCTIONS) ×3 IMPLANT
TOWEL OR 17X24 6PK STRL BLUE (TOWEL DISPOSABLE) ×6 IMPLANT
TOWEL OR 17X26 4PK STRL BLUE (TOWEL DISPOSABLE) ×3 IMPLANT
VACURETTE 10 RIGID CVD (CANNULA) ×3 IMPLANT
VACURETTE 14MM CVD 1/2 BASE (CANNULA) ×3 IMPLANT
VACURETTE 7MM CVD STRL WRAP (CANNULA) IMPLANT
VACURETTE 8 RIGID CVD (CANNULA) ×3 IMPLANT
VACURETTE 9 RIGID CVD (CANNULA) IMPLANT

## 2019-06-09 NOTE — Progress Notes (Signed)
Dr. Ilda Basset at the bedside:1540 Time of delivery: 1548 Patient up to void her bladder:1602 Patient holding baby Formal ultrasound arrived at the bedside: 1642  Called house coverage, patient's boyfriend to come back to see baby and be with patient, patient's mother is going to leave. Discussed with security by house coverage.

## 2019-06-09 NOTE — Transfer of Care (Signed)
Immediate Anesthesia Transfer of Care Note  Patient: Theresa Fletcher  Procedure(s) Performed: DILATATION AND EVACUATION (N/A Uterus)  Patient Location: PACU  Anesthesia Type:General  Level of Consciousness: awake, alert  and oriented  Airway & Oxygen Therapy: Patient Spontanous Breathing and Patient connected to nasal cannula oxygen  Post-op Assessment: Report given to RN and Post -op Vital signs reviewed and stable  Post vital signs: Reviewed and stable  Last Vitals:  Vitals Value Taken Time  BP 111/67 06/09/19 2031  Temp 36.7 C 06/09/19 2031  Pulse 86 06/09/19 2035  Resp 22 06/09/19 2035  SpO2 98 % 06/09/19 2035  Vitals shown include unvalidated device data.  Last Pain:  Vitals:   06/09/19 2031  TempSrc: Oral  PainSc: 0-No pain         Complications: No apparent anesthesia complications

## 2019-06-09 NOTE — Progress Notes (Signed)
OB Note U/s shows retained POCs. Patient stable, with scant to no bleeding. NPO since yesterday. D/w her and recommend suction d&c which she is amenable to. 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 Pt to let us know if she'd like anora testing.     I said if everything goes fine, which I anticipate it should they are fine for d/c from the pacu which they desire.   D/w OR and will wait for covid testing to come back and then can proceed.   Will need rhig work up and rhogam post procedure.  Durene Romans MD Attending Center for [facility] (Faculty Practice) 06/09/2019 Time: 5400

## 2019-06-09 NOTE — MAU Note (Signed)
Pt reports some light bleeding since this morning with some lower abd pain.  Pt reports feeling something coming out of her vagina during a bowel movement.

## 2019-06-09 NOTE — Anesthesia Preprocedure Evaluation (Signed)
Anesthesia Evaluation  Patient identified by MRN, date of birth, ID band Patient awake    Reviewed: Allergy & Precautions, H&P , NPO status , Patient's Chart, lab work & pertinent test results  Airway Mallampati: II  TM Distance: >3 FB Neck ROM: full    Dental no notable dental hx. (+) Teeth Intact   Pulmonary neg pulmonary ROS,    Pulmonary exam normal breath sounds clear to auscultation       Cardiovascular negative cardio ROS Normal cardiovascular exam Rhythm:regular Rate:Normal     Neuro/Psych negative neurological ROS  negative psych ROS   GI/Hepatic negative GI ROS, Neg liver ROS,   Endo/Other  negative endocrine ROS  Renal/GU negative Renal ROS  negative genitourinary   Musculoskeletal negative musculoskeletal ROS (+)   Abdominal (+) + obese,   Peds  Hematology  (+) Blood dyscrasia, anemia ,   Anesthesia Other Findings   Reproductive/Obstetrics (+) Pregnancy                             Anesthesia Physical Anesthesia Plan  ASA: II  Anesthesia Plan: General   Post-op Pain Management:    Induction: Intravenous  PONV Risk Score and Plan: 4 or greater and Ondansetron, Dexamethasone, Midazolam and Scopolamine patch - Pre-op  Airway Management Planned: LMA  Additional Equipment: None  Intra-op Plan:   Post-operative Plan:   Informed Consent: I have reviewed the patients History and Physical, chart, labs and discussed the procedure including the risks, benefits and alternatives for the proposed anesthesia with the patient or authorized representative who has indicated his/her understanding and acceptance.     Dental advisory given  Plan Discussed with: CRNA  Anesthesia Plan Comments:         Anesthesia Quick Evaluation

## 2019-06-09 NOTE — Progress Notes (Addendum)
Spoke with house coverage, obtained camera to take pictures of the baby. Will take pictures and comfort items to the PACU for the patient. Patient still unsure what she wants to do but expressed interest in taking the baby home for burial.  2050: keepsake items taken to the patient in PACU, spoke with RN about the location of the baby in the comfort room in [facility], patient expressed possibly wanting to see the baby after surgery.

## 2019-06-09 NOTE — Anesthesia Postprocedure Evaluation (Signed)
Anesthesia Post Note  Patient: Arboriculturist  Procedure(s) Performed: DILATATION AND EVACUATION (N/A Uterus)     Patient location during evaluation: PACU Anesthesia Type: General Level of consciousness: awake Pain management: pain level controlled Vital Signs Assessment: post-procedure vital signs reviewed and stable Respiratory status: spontaneous breathing Cardiovascular status: stable Postop Assessment: no apparent nausea or vomiting Anesthetic complications: no    Last Vitals:  Vitals:   06/09/19 2145 06/09/19 2211  BP: 109/71 103/66  Pulse: 86 100  Resp: 17   Temp:    SpO2: 96% 97%    Last Pain:  Vitals:   06/09/19 2145  TempSrc:   PainSc: 3    Pain Goal:                   Huston Foley

## 2019-06-09 NOTE — Progress Notes (Signed)
Patient's mother to be her support person. Currently in room with the patient.

## 2019-06-09 NOTE — Discharge Instructions (Signed)
   We will discuss your surgery once again in detail at your post-op visit in two to four weeks. If you haven't already done so, please call to make your appointment as soon as possible.  Dilation and Curettage or Vacuum Curettage, Care After These instructions give you information on caring for yourself after your procedure. Your doctor may also give you more specific instructions. Call your doctor if you have any problems or questions after your procedure. HOME CARE Do not drive for 24 hours. Wait 1 week before doing any activities that wear you out. Do not stand for a long time. Limit stair climbing to once or twice a day. Rest often. Continue with your usual diet. Drink enough fluids to keep your pee (urine) clear or pale yellow. If you have a hard time pooping (constipation), you may: Take a medicine to help you go poop (laxative) as told by your doctor. Eat more fruit and bran. Drink more fluids. Take showers, not baths, for as long as told by your doctor. Do not swim or use a hot tub until your doctor says it is okay. Have someone with you for 1day after the procedure. Do not douche, use tampons, or have sex (intercourse) until seen by your doctor Only take medicines as told by your doctor. Do not take aspirin. It can cause bleeding. Keep all doctor visits. GET HELP IF: You have cramps or pain not helped by medicine. You have new pain in the belly (abdomen). You have a bad smelling fluid coming from your vagina. You have a rash. You have problems with any medicine. GET HELP RIGHT AWAY IF:  You start to bleed more than a regular period. You have a fever. You have chest pain. You have trouble breathing. You feel dizzy or feel like passing out (fainting). You pass out. You have pain in the tops of your shoulders. You have vaginal bleeding with or without clumps of blood (blood clots). MAKE SURE YOU: Understand these instructions. Will watch your condition. Will get help  right away if you are not doing well or get worse. Document Released: 05/31/2008 Document Revised: 08/27/2013 Document Reviewed: 03/21/2013 ExitCare Patient Information 2015 [employer]. This information is not intended to replace advice given to you by your health care provider. Make sure you discuss any questions you have with your health care provider.   

## 2019-06-09 NOTE — Anesthesia Procedure Notes (Signed)
Procedure Name: LMA Insertion Date/Time: 06/09/2019 7:44 PM Performed by: Genevie Ann, CRNA Pre-anesthesia Checklist: Patient identified, Emergency Drugs available, Suction available, Patient being monitored and Timeout performed Patient Re-evaluated:Patient Re-evaluated prior to induction Oxygen Delivery Method: Circle system utilized Preoxygenation: Pre-oxygenation with 100% oxygen Induction Type: IV induction Ventilation: Mask ventilation without difficulty LMA: LMA inserted LMA Size: 4.0 Tube size: 4.0 mm Number of attempts: 1 Placement Confirmation: positive ETCO2 ETT to lip (cm): at lips.

## 2019-06-09 NOTE — Op Note (Signed)
Operative Note   06/09/2019  PRE-OP DIAGNOSIS: Retained products of conception after 16wk vaginal delivery due to cervical insufficiency. Rh negative   POST-OP DIAGNOSIS: Same  SURGEON: Surgeon(s) and Role:    * Savilla Turbyfill, Eduard Clos, MD - Primary  ASSISTANT: None  PROCEDURE:  Ultrasound guided suction dilation and curettage  ANESTHESIA: Monitor Anesthesia Care and paracervical block  ESTIMATED BLOOD LOSS: 258mL  DRAINS: I/O foley 158mL  TOTAL IV FLUIDS: per anesthesia note  SPECIMENS: products of conception to pathology  VTE PROPHYLAXIS: SCDs to the bilateral lower extremities  ANTIBIOTICS: Doxycycline 100mg  IV x 1 pre op  COMPLICATIONS: none  DISPOSITION: PACU - hemodynamically stable.  CONDITION: stable  BLOOD TYPE: O NEG. Rhogam given:yes  FINDINGS: Ultrasound showed no more retained products of conception at the conclusion of the case  PROCEDURE IN DETAIL:  After informed consent was obtained, the patient was taken to the operating room where anesthesia was obtained without difficulty. The patient was positioned in the dorsal lithotomy position in [place]. The patient was examined under anesthesia, with the above noted findings.  The bi-valved speculum was placed inside the patient's vagina, and the the anterior lip of the cervix was seen and grasped with the tenaculum.  A paracervical block was achieved with 73mL of 1% lidocaine and then the cervix already easily passed a #12 cannula. The suction was then calibrated to 23mmHg and connected to the number 12 cannula, which was then introduced with the above noted findings. A gentle curettage was done at the end and yield no products of conception.   The suction was then done one more time to remove any remaining curettage material.   Excellent hemostasis was noted, and all instruments were removed, with excellent hemostasis noted throughout.  She was then taken out of dorsal lithotomy. The patient tolerated the procedure  well.  Sponge, lap and instrument counts were correct x2.  The patient was taken to recovery room in excellent condition.  Durene Romans MD Attending Center for [employer] [facility])

## 2019-06-09 NOTE — Progress Notes (Signed)
OB NOte  Easily delivered fetus en caul with one push. Placenta doesn't appear to be attached. Speculum exam done and cervix is very deep in the vagina and large graves speculum had to be used. Cervix visualized and cervix closed with scant oozing. Ringed forceps placed into cervical canal and no products of conception teased out. Bedside transabdominal u/s done and thin endometrial stripe seen and no A-V blood flow noted on sagittal or coronal view. Vaginal exam done with speculum and manually again and no products of conception noted. Will get formal u/s to see if retained products of conception. Pt hemodynamically stable and to stay NPO.   Durene Romans MD Attending Center for [employer] [facility] (Faculty Practice) 06/09/2019 Time: [phone]

## 2019-06-09 NOTE — MAU Provider Note (Addendum)
Chief Complaint: Abdominal Pain and Vaginal Bleeding   First Provider Initiated Contact with Patient 06/09/19 1321     SUBJECTIVE HPI: Theresa Fletcher is a 23 y.o. G2P1001 at 64w2dwho presents to [facility] by EMS reporting cramping and spotting this morning followed by sensation of something coming out if her vagina while having a BM immediately before coming to MAU.  Vaginal Bleeding: spotting Passage of tissue or clots: Denies Dizziness: Denies  O NEG (Received Rhophylac 04/30/19)  Pain Location: Low abd Quality: cramping Severity: 8/10 on pain scale Duration: <12 hours Course: Worsening Context: [redacted] weeks gestation  Timing: Intermittent Modifying factors: None. Hasn't tried anything  Associated signs and symptoms: Pos for spotting, something coming out of her vagina.  [facility] [place] Performed for threatened miscarriage. Nml NIPS. Neg GC/Chlamydia cultures 04/2019.  Hx term vaginal delivery x 1.   Past Medical History:  Diagnosis Date  . Anemia   . Breast lump on right side at 10 o'clock position 03/12/2019  . Breast pain, right 03/12/2019   OB History  Gravida Para Term Preterm AB Living  _0 SAB TAB Ectopic Multiple Live Births          1    # Outcome Date GA Lbr Len/2nd Weight Sex Delivery Anes PTL Lv  2 Current           1 Term 07/19/14   3204 g  Vag-Spont   LIV   Past Surgical History:  Procedure Laterality Date  . NO PAST SURGERIES     Social History   Socioeconomic History  . Marital status: Single    Spouse name: Not on file  . Number of children: 1  . Years of education: Not on file  . Highest education level: 12th grade  Occupational History  . Not on file  Social Needs  . Financial resource strain: Not on file  . Food insecurity    Worry: Not on file    Inability: Not on file  . Transportation needs    Medical: No    Non-medical: No  Tobacco Use  . Smoking status: Never Smoker   . Smokeless tobacco: Never Used  Substance and Sexual Activity  . Alcohol use: Not Currently  . Drug use: Not Currently  . Sexual activity: Yes    Birth control/protection: None  Lifestyle  . Physical activity    Days per week: Not on file    Minutes per session: Not on file  . Stress: Not on file  Relationships  . Social [place]: Not on file    Gets together: Not on file    Attends religious service: Not on file    Active member of club or organization: Not on file    Attends meetings of clubs or organizations: Not on file    Relationship status: Not on file  . Intimate partner violence    Fear of current or ex partner: Not on file    Emotionally abused: Not on file    Physically abused: Not on file    Forced sexual activity: Not on file  Other Topics Concern  . Not on file  Social History Narrative  . Not on file   No current facility-administered medications on file prior to encounter.    Current Outpatient Medications on File Prior to Encounter  Medication Sig Dispense Refill  . Blood Pressure KIT  1 Device by Does not apply route once a week. To be monitored weekly from home 1 kit 0  . diphenhydrAMINE (BENADRYL) 25 MG tablet Take 1 tablet (25 mg total) by mouth every 6 (six) hours as needed. (Patient not taking: Reported on 05/06/2019) 30 tablet 0  . Doxylamine-Pyridoxine (DICLEGIS) 10-10 MG TBEC Take 2 tabs qhs then add 1 tab A.M and midday prn 90 tablet 6  . metroNIDAZOLE (METROGEL VAGINAL) 0.75 % vaginal gel Place 1 Applicatorful vaginally at bedtime. Insert one applicator, at bedtime, for 5 nights. (Patient not taking: Reported on 05/06/2019) 70 g 0  . ondansetron (ZOFRAN) 4 MG tablet Take 1 tablet (4 mg total) by mouth every 6 (six) hours as needed for nausea or vomiting. 30 tablet 1  . polyethylene glycol (MIRALAX / GLYCOLAX) 17 g packet Take 17 g by mouth 2 (two) times daily as needed. 30 each 1  . Prenatal Vit-Fe Fumarate-FA  (MULTIVITAMIN-PRENATAL) 27-0.8 MG TABS tablet Take 1 tablet by mouth daily at 12 noon.     No Known Allergies  I have reviewed the past Medical Hx, Surgical Hx, Social Hx, Allergies and Medications.   Review of Systems  Constitutional: Negative for chills and fever.  Gastrointestinal: Positive for abdominal pain. Negative for nausea and vomiting.  Genitourinary: Positive for vaginal bleeding. Negative for dysuria and vaginal discharge.    OBJECTIVE Patient Vitals for the past 24 hrs:  BP Temp Temp src Pulse Resp SpO2  06/09/19 1302 127/78 97.9 F (36.6 C) Oral 85 18 99 %   Constitutional: Well-developed, well-nourished female in mild distress, tearful.  Cardiovascular: normal rate Respiratory: normal rate and effort.  GI: Abd soft, non-tender. Pos BS x 4. Fundus 2/SP MS: Extremities nontender, no edema, normal ROM Neurologic: Alert and oriented x 4.  GU: BOW protruding out of vagina. ~4 inches. No fetal parts visible in amniotic fluid. No blood seen. No LOF.   RN unable to auscultate FHR. Formal US ordered.   LAB RESULTS Results for orders placed or performed during the hospital encounter of 06/09/19 (from the past 24 hour(s))  CBC with Differential/Platelet     Status: Abnormal   Collection Time: 06/09/19  1:22 PM  Result Value Ref Range   WBC 9.6 4.0 - 10.5 K/uL   RBC 3.67 (L) 3.87 - 5.11 MIL/uL   Hemoglobin 11.8 (L) 12.0 - 15.0 g/dL   HCT 34.4 (L) 36.0 - 46.0 %   MCV 93.7 80.0 - 100.0 fL   MCH 32.2 26.0 - 34.0 pg   MCHC 34.3 30.0 - 36.0 g/dL   RDW 12.8 11.5 - 15.5 %   Platelets 235 150 - 400 K/uL   nRBC 0.0 0.0 - 0.2 %   Neutrophils Relative % 65 %   Neutro Abs 6.2 1.7 - 7.7 K/uL   Lymphocytes Relative 26 %   Lymphs Abs 2.5 0.7 - 4.0 K/uL   Monocytes Relative 7 %   Monocytes Absolute 0.7 0.1 - 1.0 K/uL   Eosinophils Relative 1 %   Eosinophils Absolute 0.1 0.0 - 0.5 K/uL   Basophils Relative 0 %   Basophils Absolute 0.0 0.0 - 0.1 K/uL   Immature Granulocytes 1 %    Abs Immature Granulocytes 0.06 0.00 - 0.07 K/uL  Type and screen     Status: None (Preliminary result)   Collection Time: 06/09/19  1:22 PM  Result Value Ref Range   ABO/RH(D) O NEG    Antibody Screen POS    Sample Expiration 06/12/2019,2359  Antibody Identification PASSIVELY ACQUIRED ANTI-D    Unit Number [id number]    Blood Component Type RED CELLS,LR    Unit division 00    Status of Unit ALLOCATED    Transfusion Status OK TO TRANSFUSE    Crossmatch Result COMPATIBLE    Unit Number [id number]    Blood Component Type RBC LR PHER1    Unit division 00    Status of Unit ALLOCATED    Transfusion Status OK TO TRANSFUSE    Crossmatch Result COMPATIBLE     IMAGING [place] Mfm Ob Limited  Result Date: 06/09/2019 ----------------------------------------------------------------------  OBSTETRICS REPORT                       (Signed Final 06/09/2019 02:46 pm) ---------------------------------------------------------------------- Patient Info  ID #:       [id number]                          D.O.B.:  1995-11-08 (23 yrs)  Name:       Theresa Fletcher                    Visit Date: 06/09/2019 01:49 pm ---------------------------------------------------------------------- Performed By  Performed By:     Wilnette Kales        Secondary Phy.:    Manya Silvas                    RDMS,RVT                                                              CNM  Attending:        Sander Nephew      Address:           [address]                    MD                                                              [place]                                                              [place]  Referred By:      MAU Nursing-           [facility] ---------------------------------------------------------------------- Orders   #  Description                           Code         Ordered By   1  [place] MFM OB LIMITED                    23536.14     Manya Silvas  ----------------------------------------------------------------------   #  Order #                    Accession #                 Episode #   1  [id number]                  [id number]                  [id number]  ---------------------------------------------------------------------- Indications   Encounter for assessment of fetal demise       O76.89   [redacted] weeks gestation of pregnancy                Z3A.16   Threatened abortion                            O20.0  ---------------------------------------------------------------------- Fetal Evaluation  Num Of Fetuses:          1  Cardiac Activity:        Absent  Presentation:            Cephalic  Placenta:                Anterior  Amniotic Fluid  AFI FV:      Anhydramnios  Comment:    Minimal fluid seen in the uterus cavity . Fetus locatad in the              lower uterin segment. ---------------------------------------------------------------------- Biometry  BPD:      31.4  mm     G. Age:  15w 6d         26  %    CI:        63.36   %    70 - 86                                                          FL/HC:       13.5  %    13.3 - 16.5  HC:      127.1  mm     G. Age:  16w 3d         45  %    HC/AC:       1.13       1.05 - 1.39  AC:      112.3  mm     G. Age:  17w 0d         76  %    FL/BPD:      54.5  %  FL:  17.1  mm     G. Age:  15w 0d          7  %    FL/AC:       15.2  %    20 - 24  Est. FW:     145   gm     0 lb 5 oz     30  % ---------------------------------------------------------------------- OB History  Gravidity:    2         Term:   1  Living:       1 ---------------------------------------------------------------------- Gestational Age  LMP:           16w 2d        Date:  02/15/19                 EDD:   11/22/19  U/S Today:     16w 1d                                        EDD:   11/23/19  Best:          16w 2d     Det. By:  LMP  (02/15/19)           EDD:   11/22/19 ---------------------------------------------------------------------- Anatomy  Other:  Technically difficult due to fetal position. ---------------------------------------------------------------------- Cervix Uterus Adnexa  Cervix  Dilated ---------------------------------------------------------------------- Impression  No cardiac activity  Anyhydramnios ---------------------------------------------------------------------- Recommendations  Discussed Milbert Coulter CNM  Clincal correlation recommended. ----------------------------------------------------------------------               Sander Nephew, MD Electronically Signed Final Report   06/09/2019 02:46 pm ----------------------------------------------------------------------   MAU COURSE Orders Placed This Encounter  Procedures  . [place] MFM OB LIMITED  . CBC with Differential/Platelet  . Urinalysis, Routine w reflex microscopic  . Diet NPO time specified  . Type and screen   Meds ordered this encounter  Medications  . lactated ringers infusion  . fentaNYL (SUBLIMAZE) injection 100 mcg     MDM - Membranes prolapsed out of vagina. - Fetus S=D per [place] - Non-viable pregnancy. Pt and mother informed. Pt appropriately tearful. Pt wished to go ahead and deliver. Discussed w/ Dr. Ilda Basset and House Coverage. Will be a while before [facility] or L&D beds available. MAU staff and Dr. Ilda Basset OK to deliver in MAU. Dr. Ilda Basset at [facility] for delivery.    ASSESSMENT 1. Incomplete miscarriage   2. Threatened miscarriage   3. Rh negative, antepartum, second trimester     PLAN Prep for delivery in MAU Blood Typed and Screened Cytotec and Pitocin at [facility] Support given COVID testing ordered in case pt has to go to OR.  Care turned over to Dr. Ilda Basset for delivery.   Tamala Julian, [place], [place] 06/09/2019  4:14 PM  4

## 2019-06-10 ENCOUNTER — Encounter: Payer: Self-pay | Admitting: Radiology

## 2019-06-10 ENCOUNTER — Telehealth: Payer: Self-pay | Admitting: Radiology

## 2019-06-10 LAB — RH IG WORKUP (INCLUDES ABO/RH)
ABO/RH(D): O NEG
Gestational Age(Wks): 16
Unit division: 0

## 2019-06-10 NOTE — Telephone Encounter (Signed)
Left message for patient, follow up depression check on 06/18/19 @ 1:15 with Dr Ilda Basset

## 2019-06-11 LAB — BPAM RBC
Blood Product Expiration Date: 202011022359
Blood Product Expiration Date: 202011032359
Unit Type and Rh: 9500
Unit Type and Rh: 9500

## 2019-06-11 LAB — TYPE AND SCREEN
ABO/RH(D): O NEG
Antibody Screen: POSITIVE
Unit division: 0
Unit division: 0

## 2019-06-11 LAB — SURGICAL PATHOLOGY

## 2019-06-12 ENCOUNTER — Encounter (HOSPITAL_COMMUNITY): Payer: Self-pay | Admitting: Obstetrics and Gynecology

## 2019-06-18 ENCOUNTER — Ambulatory Visit (INDEPENDENT_AMBULATORY_CARE_PROVIDER_SITE_OTHER): Payer: Medicaid Other | Admitting: Obstetrics and Gynecology

## 2019-06-18 ENCOUNTER — Encounter: Payer: Self-pay | Admitting: Obstetrics and Gynecology

## 2019-06-18 ENCOUNTER — Other Ambulatory Visit: Payer: Self-pay

## 2019-06-18 VITALS — BP 119/80 | HR 94 | Wt 183.0 lb

## 2019-06-18 DIAGNOSIS — F53 Postpartum depression: Secondary | ICD-10-CM | POA: Diagnosis not present

## 2019-06-18 DIAGNOSIS — O99345 Other mental disorders complicating the puerperium: Secondary | ICD-10-CM | POA: Diagnosis not present

## 2019-06-18 NOTE — Progress Notes (Signed)
Obstetrics and Gynecology Visit Return Patient Evaluation  Appointment Date: 06/18/2019  Primary Care Provider: Patient, No Pcp Per  OB[facility]N Clinic: [facility]  Chief Complaint: f/u midtrimester loss  History of Present Illness:  Theresa Fletcher is a 23 y.o. s/p SVD at [place] with need for d&c for rPOCs. Preg c/b likely cx insufficiency  No bleeding, pain, fevers  She is coping well to the loss and has good support at home.    Review of Systems: as noted in the History of Present Illness.  Medications:  None  Allergies: has No Known Allergies.  Physical Exam:  BP 119/80   Pulse 94   Wt 183 lb (83 kg)   Breastfeeding Unknown   BMI 36.96 kg/m  Body mass index is 36.96 kg/m. General appearance: Well nourished, well developed female in no acute distress.  Neuro/Psych:  Normal mood and affect.    Assessment: pt doing well  Plan: D/w her re: okay to come off pelvic rest. I told her I recommend waiting until next year. D/w her that period should come back sometime in the next month and if not to let us know. I told he that when she finds out she's pregnant again to let us know asap and I recommend she have a cerclage at 12-14 weeks. Pt to continue PNV for now.   RTC: PRN  Durene Romans MD Attending Center for [facility])

## 2019-07-01 ENCOUNTER — Ambulatory Visit (HOSPITAL_COMMUNITY): Payer: Medicaid Other

## 2019-07-02 ENCOUNTER — Encounter: Payer: Medicaid Other | Admitting: Obstetrics & Gynecology

## 2019-07-11 ENCOUNTER — Other Ambulatory Visit (INDEPENDENT_AMBULATORY_CARE_PROVIDER_SITE_OTHER): Payer: Medicaid Other

## 2019-07-11 ENCOUNTER — Other Ambulatory Visit: Payer: Self-pay

## 2019-07-11 VITALS — BP 132/82 | HR 74

## 2019-07-11 DIAGNOSIS — R3915 Urgency of urination: Secondary | ICD-10-CM | POA: Diagnosis not present

## 2019-07-11 LAB — POCT URINALYSIS DIPSTICK

## 2019-07-11 MED ORDER — PHENAZOPYRIDINE HCL 200 MG PO TABS: 200.0000 mg | ORAL_TABLET | Freq: Three times a day (TID) | ORAL | 1 refills | Status: DC | PRN

## 2019-07-11 MED ORDER — NITROFURANTOIN MONOHYD MACRO 100 MG PO CAPS: 100.0000 mg | ORAL_CAPSULE | Freq: Two times a day (BID) | ORAL | 0 refills | Status: AC

## 2019-07-11 NOTE — Progress Notes (Signed)
SUBJECTIVE: Theresa Fletcher is a 23 y.o. female who complains of urinary frequency, urgency and dysuria x 3 days, without flank pain, fever, chills, or abnormal vaginal discharge or bleeding.   OBJECTIVE: Appears well, in no apparent distress.  Vital signs are normal. Urine dipstick shows positive for leukocytes and RBC's    ASSESSMENT: Dysuria  PLAN: Treatment per orders. Will send urine for culture. Call or return to clinic prn if these symptoms worsen or fail to improve as anticipated.

## 2019-07-11 NOTE — Progress Notes (Signed)
Patient seen and assessed by nursing staff.  Agree with documentation and plan.  

## 2019-07-13 LAB — URINE CULTURE

## 2019-07-15 ENCOUNTER — Telehealth: Payer: Self-pay | Admitting: *Deleted

## 2019-07-15 NOTE — Telephone Encounter (Signed)
-----   Message from Donnamae Jude, MD sent at 07/15/2019  8:15 AM EST ----- Has GBS--if no improvement with Macrobid, consider change to Amoxil 875 bid x 7 days

## 2019-07-15 NOTE — Telephone Encounter (Signed)
Pt informed of results. Pt states she is improving but will let us know if it does not get better and we will switch her antibiotic per Dr Blane Ohara note.

## 2019-09-06 NOTE — L&D Delivery Note (Signed)
Theresa Fletcher is a 24 y.o. [id number] s/p vaginal deliveryat [redacted]w[redacted]d.  She was admitted for labor in the setting of spontaneous ROM.   ROM:0h clearfluid GBS Status:Positive Maximum Maternal Temperature:98.2*F  Labor Progress: Pt in latent labor on admission. She continued to progress well. After AROM, she progressed rapidly and delivered without complication as noted below.  Delivery Date/Time:06/07/2020 @ 1404 Delivery: Called to room and artificially ruptured membranes at 1354. Within 10 minutes, head delivered and nuchal cord present x1. Nuchal cord easily reduced. Shoulder and body then delivered in usual fashion. Infant with spontaneous cry, placed on mother's abdomen, dried and stimulated. Cord clamped x 2 after 1-minute delay, and cut byFOBunder my direct supervision. Cord blood drawn. Placenta delivered spontaneously with gentle cord traction. Fundus firm with massage. Labia, perineum, vagina, and cervix were inspected; no lacerations visualized.   Placenta:intact, 3-vessel cord, sent to L&D Complications:none Lacerations:none EBL:57 ml Analgesia:epidural  Infant:Female APGARs 9 & 9 weight 2945g  Fayette Pho, MD  I was gloved and present for entire delivery SVD without incident No difficulty with shoulders No lacerations  Patient continues to desire inpatient BTL. Consent signed 04/01/2020, visible in Media tab under Physician Order on this date. Patient NPO since last night aside from sips of water. OR Consulting civil engineer and Dr. Raynelle Dick notified.   Clayton Bibles, CNM 06/07/20 3:02 PM

## 2019-11-02 ENCOUNTER — Other Ambulatory Visit: Payer: Self-pay

## 2019-11-02 ENCOUNTER — Encounter: Payer: Self-pay | Admitting: Student

## 2019-11-02 ENCOUNTER — Inpatient Hospital Stay (HOSPITAL_COMMUNITY): Payer: Medicaid Other

## 2019-11-02 ENCOUNTER — Encounter (HOSPITAL_COMMUNITY): Payer: Self-pay | Admitting: Obstetrics & Gynecology

## 2019-11-02 ENCOUNTER — Inpatient Hospital Stay (HOSPITAL_COMMUNITY)
Admission: AD | Admit: 2019-11-02 | Discharge: 2019-11-02 | Disposition: A | Discharge status: Home / Self Care | Payer: Medicaid Other | Attending: Obstetrics & Gynecology | Admitting: Obstetrics & Gynecology

## 2019-11-02 DIAGNOSIS — O26851 Spotting complicating pregnancy, first trimester: Secondary | ICD-10-CM | POA: Diagnosis present

## 2019-11-02 DIAGNOSIS — O418X10 Other specified disorders of amniotic fluid and membranes, first trimester, not applicable or unspecified: Secondary | ICD-10-CM | POA: Insufficient documentation

## 2019-11-02 DIAGNOSIS — O468X1 Other antepartum hemorrhage, first trimester: Secondary | ICD-10-CM

## 2019-11-02 DIAGNOSIS — M549 Dorsalgia, unspecified: Secondary | ICD-10-CM | POA: Diagnosis not present

## 2019-11-02 DIAGNOSIS — R109 Unspecified abdominal pain: Secondary | ICD-10-CM | POA: Diagnosis not present

## 2019-11-02 DIAGNOSIS — O209 Hemorrhage in early pregnancy, unspecified: Secondary | ICD-10-CM | POA: Diagnosis not present

## 2019-11-02 DIAGNOSIS — O26891 Other specified pregnancy related conditions, first trimester: Secondary | ICD-10-CM | POA: Diagnosis not present

## 2019-11-02 DIAGNOSIS — O208 Other hemorrhage in early pregnancy: Secondary | ICD-10-CM | POA: Diagnosis not present

## 2019-11-02 DIAGNOSIS — Z3A01 Less than 8 weeks gestation of pregnancy: Secondary | ICD-10-CM | POA: Diagnosis not present

## 2019-11-02 DIAGNOSIS — Z3490 Encounter for supervision of normal pregnancy, unspecified, unspecified trimester: Secondary | ICD-10-CM

## 2019-11-02 LAB — URINALYSIS, ROUTINE W REFLEX MICROSCOPIC
Bilirubin Urine: NEGATIVE
Glucose, UA: NEGATIVE mg/dL
Ketones, ur: NEGATIVE mg/dL
Leukocytes,Ua: NEGATIVE
Nitrite: NEGATIVE
Protein, ur: NEGATIVE mg/dL
Specific Gravity, Urine: 1.011 (ref 1.005–1.030)
pH: 6 (ref 5.0–8.0)

## 2019-11-02 LAB — CBC
HCT: 36.3 % (ref 36.0–46.0)
Hemoglobin: 12.3 g/dL (ref 12.0–15.0)
MCH: 31.1 pg (ref 26.0–34.0)
MCHC: 33.9 g/dL (ref 30.0–36.0)
MCV: 91.7 fL (ref 80.0–100.0)
Platelets: 226 10*3/uL (ref 150–400)
RBC: 3.96 MIL/uL (ref 3.87–5.11)
RDW: 13.2 % (ref 11.5–15.5)
WBC: 8.7 10*3/uL (ref 4.0–10.5)
nRBC: 0 % (ref 0.0–0.2)

## 2019-11-02 LAB — HCG, QUANTITATIVE, PREGNANCY: hCG, Beta Chain, Quant, S: 53679 m[IU]/mL — ABNORMAL HIGH (ref <5)

## 2019-11-02 LAB — HIV ANTIBODY (ROUTINE TESTING W REFLEX): HIV Screen 4th Generation wRfx: NONREACTIVE

## 2019-11-02 LAB — WET PREP, GENITAL
Sperm: NONE SEEN
Trich, Wet Prep: NONE SEEN
Yeast Wet Prep HPF POC: NONE SEEN

## 2019-11-02 LAB — POCT PREGNANCY, URINE: Preg Test, Ur: POSITIVE — AB

## 2019-11-02 MED ORDER — RHO D IMMUNE GLOBULIN 1500 UNIT/2ML IJ SOSY
300.0000 ug | PREFILLED_SYRINGE | Freq: Once | INTRAMUSCULAR | Status: AC
  Administered 2019-11-02: 300 ug via INTRAMUSCULAR
  Filled 2019-11-02: qty 2

## 2019-11-02 MED ORDER — ONDANSETRON HCL 4 MG PO TABS: 4.0000 mg | ORAL_TABLET | Freq: Four times a day (QID) | ORAL | 1 refills | Status: DC | PRN

## 2019-11-02 NOTE — MAU Provider Note (Signed)
Patient Theresa Fletcher is a 24 y.o. G3P1011  at [redacted]w[redacted]d here with complaints of spotting and cramping for the past two days. She had a miscarriage at 16 weeks 4 montsh ago, and she is concerned that she is having another miscarriage. She denies pain with urination, other abnormal vaginal discharge, nausea, vomiting, SOB.  History     CSN: [id number]  Arrival date and time: 11/02/19 7001   None     Chief Complaint  Patient presents with  . Abdominal Pain  . Back Pain  . Vaginal Bleeding   Abdominal Pain This is a new problem. The current episode started in the past 7 days. The problem occurs intermittently. The pain is located in the RLQ. The pain is at a severity of 6/10. The quality of the pain is cramping. The abdominal pain radiates to the back. Pertinent negatives include no constipation, diarrhea, dysuria, nausea or vomiting. Nothing aggravates the pain. The pain is relieved by nothing.  Back Pain Associated symptoms include abdominal pain. Pertinent negatives include no dysuria.  Vaginal Bleeding Associated symptoms include abdominal pain and back pain. Pertinent negatives include no constipation, diarrhea, dysuria, nausea or vomiting.    OB History    Gravida  3   Para  1   Term  1   Preterm      AB  1   Living  1     SAB  1   TAB      Ectopic      Multiple      Live Births  1           Past Medical History:  Diagnosis Date  . Anemia   . Breast lump on right side at 10 o'clock position 03/12/2019  . Breast pain, right 03/12/2019    Past Surgical History:  Procedure Laterality Date  . DILATION AND EVACUATION N/A 06/09/2019   Procedure: DILATATION AND EVACUATION;  Surgeon: Aletha Halim, MD;  Location: [facility];  Service: Gynecology;  Laterality: N/A;  . NO PAST SURGERIES      Family History  Problem Relation Age of Onset  . Diabetes Maternal Grandmother   . Hypertension Maternal Grandfather   . Healthy Mother   . Healthy Father     Social  History   Tobacco Use  . Smoking status: Never Smoker  . Smokeless tobacco: Never Used  Substance Use Topics  . Alcohol use: Not Currently  . Drug use: Not Currently    Allergies: No Known Allergies  Medications Prior to Admission  Medication Sig Dispense Refill Last Dose  . ibuprofen (ADVIL) 600 MG tablet Take 1 tablet (600 mg total) by mouth every 6 (six) hours as needed. 30 tablet 0   . ondansetron (ZOFRAN) 4 MG tablet Take 1 tablet (4 mg total) by mouth every 6 (six) hours as needed for nausea or vomiting. (Patient not taking: Reported on 06/18/2019) 30 tablet 1   . oxyCODONE-acetaminophen (PERCOCET/ROXICET) 5-325 MG tablet Take 1 tablet by mouth every 6 (six) hours as needed. 5 tablet 0   . phenazopyridine (PYRIDIUM) 200 MG tablet Take 1 tablet (200 mg total) by mouth 3 (three) times daily as needed for pain (urethral spasm). 10 tablet 1   . polyethylene glycol (MIRALAX / GLYCOLAX) 17 g packet Take 17 g by mouth 2 (two) times daily as needed. (Patient not taking: Reported on 06/18/2019) 14 each 0     Review of Systems  Gastrointestinal: Positive for abdominal pain. Negative for constipation, diarrhea,  nausea and vomiting.  Genitourinary: Positive for vaginal bleeding. Negative for dysuria.  Musculoskeletal: Positive for back pain.   Physical Exam   Blood pressure (!) 117/54, pulse 82, temperature 99 F (37.2 C), temperature source Oral, resp. rate 16, height 4\' 11"  (1.499 m), weight 85 kg, last menstrual period 09/19/2019, SpO2 100 %, unknown if currently breastfeeding.  Physical Exam  Constitutional: She appears well-developed.  HENT:  Head: Normocephalic.  Respiratory: Effort normal.  GI: Soft.  Genitourinary:    Vagina normal.     Genitourinary Comments: NEFG; no blood in the vagina, no discharge. Cervix is pink and visually closed; no CMT, suprapubic or adnexal tenderness.    Musculoskeletal:        General: Normal range of motion.     Cervical back: Normal range of  motion.  Neurological: She is alert.  Skin: Skin is warm.    MAU Course  Procedures  MDM Complete ectopic work-up done, included 09/21/2019, blood draw, vitals.  -[place] results reviewed and interpreted by me; IUP visualized with heart beat.   -Rho workup done and patient given Rhogam in MAU -UA negative -wet prep shows clue cells but patient does not meet criteria for  BV Assessment and Plan   1. Intrauterine pregnancy   2. Vaginal bleeding in pregnancy, first trimester   3. Subchorionic hemorrhage of placenta in first trimester, single or unspecified fetus    -Patient stable for discharge with bleeding precautions and pelvic rest precautions.  -Reviewed warning signs and when to return to MAU.  -Call [facility] and begin care in 6 weeks -All questions answered; work note given.   [place] Jaziel Bennett 11/02/2019, 9:07 AM

## 2019-11-02 NOTE — MAU Note (Signed)
Theresa Fletcher is a 24 y.o. at [redacted]w[redacted]d here in MAU reporting: spotting for the past 2 days, today is day 3 of spotting. Spotting has increased. Is wearing a panty liner and is having to change it a few times during the day, bleeding is bright pink. Also having cramping in her back and sharp pain in her abdomen.   LMP: 09/19/19  Onset of complaint: ongoing for a few days  Pain score: 8/10  Vitals:   11/02/19 0730  BP: (!) 117/54  Pulse: 82  Resp: 16  Temp: 99 F (37.2 C)  SpO2: 100%     Lab orders placed from triage: UA, UPT

## 2019-11-02 NOTE — Discharge Instructions (Signed)
Subchorionic Hematoma  A subchorionic hematoma is a gathering of blood between the outer wall of the embryo (chorion) and the inner wall of the womb (uterus). This condition can cause vaginal bleeding. If they cause little or no vaginal bleeding, early small hematomas usually shrink on their own and do not affect your baby or pregnancy. When bleeding starts later in pregnancy, or if the hematoma is larger or occurs in older pregnant women, the condition may be more serious. Larger hematomas may get bigger, which increases the chances of miscarriage. This condition also increases the risk of:  Premature separation of the placenta from the uterus.  Premature (preterm) labor.  Stillbirth. What are the causes? The exact cause of this condition is not known. It occurs when blood is trapped between the placenta and the uterine wall because the placenta has separated from the original site of implantation. What increases the risk? You are more likely to develop this condition if:  You were treated with fertility medicines.  You conceived through in vitro fertilization (IVF). What are the signs or symptoms? Symptoms of this condition include:  Vaginal spotting or bleeding.  Contractions of the uterus. These cause abdominal pain. Sometimes you may have no symptoms and the bleeding may only be seen when ultrasound images are taken (transvaginal ultrasound). How is this diagnosed? This condition is diagnosed based on a physical exam. This includes a pelvic exam. You may also have other tests, including:  Blood tests.  Urine tests.  Ultrasound of the abdomen. How is this treated? Treatment for this condition can vary. Treatment may include:  Watchful waiting. You will be monitored closely for any changes in bleeding. During this stage: ? The hematoma may be reabsorbed by the body. ? The hematoma may separate the fluid-filled space containing the embryo (gestational sac) from the wall of the  womb (endometrium).  Medicines.  Activity restriction. This may be needed until the bleeding stops. Follow these instructions at home:  Stay on bed rest if told to do so by your health care provider.  Do not lift anything that is heavier than 10 lbs. (4.5 kg) or as told by your health care provider.  Do not use any products that contain nicotine or tobacco, such as cigarettes and e-cigarettes. If you need help quitting, ask your health care provider.  Track and write down the number of pads you use each day and how soaked (saturated) they are.  Do not use tampons.  Keep all follow-up visits as told by your health care provider. This is important. Your health care provider may ask you to have follow-up blood tests or ultrasound tests or both. Contact a health care provider if:  You have any vaginal bleeding.  You have a fever. Get help right away if:  You have severe cramps in your stomach, back, abdomen, or pelvis.  You pass large clots or tissue. Save any tissue for your health care provider to look at.  You have more vaginal bleeding, and you faint or become lightheaded or weak. Summary  A subchorionic hematoma is a gathering of blood between the outer wall of the placenta and the uterus.  This condition can cause vaginal bleeding.  Sometimes you may have no symptoms and the bleeding may only be seen when ultrasound images are taken.  Treatment may include watchful waiting, medicines, or activity restriction. This information is not intended to replace advice given to you by your health care provider. Make sure you discuss any questions you   have with your health care provider. Document Revised: 08/04/2017 Document Reviewed: 10/18/2016 Elsevier Patient Education  2020 [employer]   First Trimester of Pregnancy  The first trimester of pregnancy is from week 1 until the end of week 13 (months 1 through 3). During this time, your baby will begin to develop inside you.  At 6-8 weeks, the eyes and face are formed, and the heartbeat can be seen on ultrasound. At the end of 12 weeks, all the baby's organs are formed. Prenatal care is all the medical care you receive before the birth of your baby. Make sure you get good prenatal care and follow all of your doctor's instructions. Follow these instructions at home: Medicines  Take over-the-counter and prescription medicines only as told by your doctor. Some medicines are safe and some medicines are not safe during pregnancy.  Take a prenatal vitamin that contains at least 600 micrograms (mcg) of folic acid.  If you have trouble pooping (constipation), take medicine that will make your stool soft (stool softener) if your doctor approves. Eating and drinking   Eat regular, healthy meals.  Your doctor will tell you the amount of weight gain that is right for you.  Avoid raw meat and uncooked cheese.  If you feel sick to your stomach (nauseous) or throw up (vomit): ? Eat 4 or 5 small meals a day instead of 3 large meals. ? Try eating a few soda crackers. ? Drink liquids between meals instead of during meals.  To prevent constipation: ? Eat foods that are high in fiber, like fresh fruits and vegetables, whole grains, and beans. ? Drink enough fluids to keep your pee (urine) clear or pale yellow. Activity  Exercise only as told by your doctor. Stop exercising if you have cramps or pain in your lower belly (abdomen) or low back.  Do not exercise if it is too hot, too humid, or if you are in a place of great height (high altitude).  Try to avoid standing for long periods of time. Move your legs often if you must stand in one place for a long time.  Avoid heavy lifting.  Wear low-heeled shoes. Sit and stand up straight.  You can have sex unless your doctor tells you not to. Relieving pain and discomfort  Wear a good support bra if your breasts are sore.  Take warm water baths (sitz baths) to soothe pain  or discomfort caused by hemorrhoids. Use hemorrhoid cream if your doctor says it is okay.  Rest with your legs raised if you have leg cramps or low back pain.  If you have puffy, bulging veins (varicose veins) in your legs: ? Wear support hose or compression stockings as told by your doctor. ? Raise (elevate) your feet for 15 minutes, 3-4 times a day. ? Limit salt in your food. Prenatal care  Schedule your prenatal visits by the twelfth week of pregnancy.  Write down your questions. Take them to your prenatal visits.  Keep all your prenatal visits as told by your doctor. This is important. Safety  Wear your seat belt at all times when driving.  Make a list of emergency phone numbers. The list should include numbers for family, friends, the hospital, and police and fire departments. General instructions  Ask your doctor for a referral to a local prenatal class. Begin classes no later than at the start of month 6 of your pregnancy.  Ask for help if you need counseling or if you need help with   nutrition. Your doctor can give you advice or tell you where to go for help.  Do not use hot tubs, steam rooms, or saunas.  Do not douche or use tampons or scented sanitary pads.  Do not cross your legs for long periods of time.  Avoid all herbs and alcohol. Avoid drugs that are not approved by your doctor.  Do not use any tobacco products, including cigarettes, chewing tobacco, and electronic cigarettes. If you need help quitting, ask your doctor. You may get counseling or other support to help you quit.  Avoid cat litter boxes and soil used by cats. These carry germs that can cause birth defects in the baby and can cause a loss of your baby (miscarriage) or stillbirth.  Visit your dentist. At home, brush your teeth with a soft toothbrush. Be gentle when you floss. Contact a doctor if:  You are dizzy.  You have mild cramps or pressure in your lower belly.  You have a nagging pain in  your belly area.  You continue to feel sick to your stomach, you throw up, or you have watery poop (diarrhea).  You have a bad smelling fluid coming from your vagina.  You have pain when you pee (urinate).  You have increased puffiness (swelling) in your face, hands, legs, or ankles. Get help right away if:  You have a fever.  You are leaking fluid from your vagina.  You have spotting or bleeding from your vagina.  You have very bad belly cramping or pain.  You gain or lose weight rapidly.  You throw up blood. It may look like coffee grounds.  You are around people who have German measles, fifth disease, or chickenpox.  You have a very bad headache.  You have shortness of breath.  You have any kind of trauma, such as from a fall or a car accident. Summary  The first trimester of pregnancy is from week 1 until the end of week 13 (months 1 through 3).  To take care of yourself and your unborn baby, you will need to eat healthy meals, take medicines only if your doctor tells you to do so, and do activities that are safe for you and your baby.  Keep all follow-up visits as told by your doctor. This is important as your doctor will have to ensure that your baby is healthy and growing well. This information is not intended to replace advice given to you by your health care provider. Make sure you discuss any questions you have with your health care provider. Document Revised: 12/13/2018 Document Reviewed: 08/30/2016 Elsevier Patient Education  2020 Elsevier Inc.  

## 2019-11-03 LAB — RH IG WORKUP (INCLUDES ABO/RH)
ABO/RH(D): O NEG
Antibody Screen: NEGATIVE
Gestational Age(Wks): 6
Unit division: 0

## 2019-11-04 LAB — GC/CHLAMYDIA PROBE AMP (~~LOC~~) NOT AT ARMC
Chlamydia: NEGATIVE
Comment: NEGATIVE
Comment: NORMAL
Neisseria Gonorrhea: NEGATIVE

## 2019-11-14 ENCOUNTER — Other Ambulatory Visit (INDEPENDENT_AMBULATORY_CARE_PROVIDER_SITE_OTHER): Payer: Self-pay

## 2019-11-14 DIAGNOSIS — Z3401 Encounter for supervision of normal first pregnancy, first trimester: Secondary | ICD-10-CM

## 2019-11-15 ENCOUNTER — Other Ambulatory Visit: Payer: Self-pay

## 2019-11-15 DIAGNOSIS — Z3401 Encounter for supervision of normal first pregnancy, first trimester: Secondary | ICD-10-CM

## 2019-11-15 MED ORDER — POLYETHYLENE GLYCOL 3350 17 G PO PACK: 17.0000 g | PACK | Freq: Two times a day (BID) | ORAL | 0 refills | Status: DC | PRN

## 2019-11-15 NOTE — Progress Notes (Signed)
Rx was accidentally printed for patient, resent Rx to [facility].

## 2019-11-28 ENCOUNTER — Ambulatory Visit (INDEPENDENT_AMBULATORY_CARE_PROVIDER_SITE_OTHER): Payer: Medicaid Other | Admitting: Family Medicine

## 2019-11-28 ENCOUNTER — Other Ambulatory Visit: Payer: Self-pay

## 2019-11-28 ENCOUNTER — Encounter: Payer: Self-pay | Admitting: Family Medicine

## 2019-11-28 ENCOUNTER — Other Ambulatory Visit (HOSPITAL_COMMUNITY)
Admission: RE | Admit: 2019-11-28 | Discharge: 2019-11-28 | Disposition: A | Discharge status: Home / Self Care | Payer: Medicaid Other | Source: Ambulatory Visit | Attending: Family Medicine | Admitting: Family Medicine

## 2019-11-28 VITALS — BP 104/69 | HR 79 | Wt 183.0 lb

## 2019-11-28 DIAGNOSIS — O0991 Supervision of high risk pregnancy, unspecified, first trimester: Secondary | ICD-10-CM | POA: Insufficient documentation

## 2019-11-28 DIAGNOSIS — O219 Vomiting of pregnancy, unspecified: Secondary | ICD-10-CM

## 2019-11-28 DIAGNOSIS — O3431 Maternal care for cervical incompetence, first trimester: Secondary | ICD-10-CM

## 2019-11-28 DIAGNOSIS — O360110 Maternal care for anti-D [Rh] antibodies, first trimester, not applicable or unspecified: Secondary | ICD-10-CM

## 2019-11-28 DIAGNOSIS — O0990 Supervision of high risk pregnancy, unspecified, unspecified trimester: Secondary | ICD-10-CM | POA: Insufficient documentation

## 2019-11-28 DIAGNOSIS — O26891 Other specified pregnancy related conditions, first trimester: Secondary | ICD-10-CM

## 2019-11-28 DIAGNOSIS — O3430 Maternal care for cervical incompetence, unspecified trimester: Secondary | ICD-10-CM

## 2019-11-28 DIAGNOSIS — Z3A10 10 weeks gestation of pregnancy: Secondary | ICD-10-CM

## 2019-11-28 DIAGNOSIS — Z6791 Unspecified blood type, Rh negative: Secondary | ICD-10-CM

## 2019-11-28 DIAGNOSIS — O468X1 Other antepartum hemorrhage, first trimester: Secondary | ICD-10-CM

## 2019-11-28 DIAGNOSIS — O418X10 Other specified disorders of amniotic fluid and membranes, first trimester, not applicable or unspecified: Secondary | ICD-10-CM

## 2019-11-28 MED ORDER — BLOOD PRESSURE KIT: 1.0000 | PACK | 0 refills | Status: DC

## 2019-11-28 NOTE — Progress Notes (Signed)
stopped bleeding last week Does have an odorous discharge

## 2019-11-28 NOTE — Progress Notes (Signed)
Subjective:   Theresa Fletcher is a 24 y.o. G3P1011 at [redacted]w[redacted]d by LMP, early ultrasound being seen today for her first obstetrical visit.  Her obstetrical history is significant for 16 wk loss last pregnancy. Patient does intend to breast feed. Pregnancy history fully reviewed.  Patient reports nausea and constipation.  HISTORY: OB History  Gravida Para Term Preterm AB Living  3 1 1  0 1 1  SAB TAB Ectopic Multiple Live Births  1 0 0 0 1    # Outcome Date GA Lbr Len/2nd Weight Sex Delivery Anes PTL Lv  3 Current           2 Term 07/19/14   7 lb 1 oz (3.204 kg)  Vag-Spont   LIV  1 SAB  [redacted]w[redacted]d      Y FD    Past Medical History:  Diagnosis Date  . Anemia   . Breast lump on right side at 10 o'clock position 03/12/2019  . Breast pain, right 03/12/2019   Past Surgical History:  Procedure Laterality Date  . DILATION AND EVACUATION N/A 06/09/2019   Procedure: DILATATION AND EVACUATION;  Surgeon: 08/09/2019, MD;  Location: [facility] ORS;  Service: Gynecology;  Laterality: N/A;  . NO PAST SURGERIES     Family History  Problem Relation Age of Onset  . Diabetes Maternal Grandmother   . Hypertension Maternal Grandfather   . Healthy Mother   . Healthy Father    Social History   Tobacco Use  . Smoking status: Never Smoker  . Smokeless tobacco: Never Used  Substance Use Topics  . Alcohol use: Not Currently  . Drug use: Not Currently   No Known Allergies Current Outpatient Medications on File Prior to Visit  Medication Sig Dispense Refill  . ondansetron (ZOFRAN) 4 MG tablet Take 1 tablet (4 mg total) by mouth every 6 (six) hours as needed for nausea or vomiting. 30 tablet 1  . polyethylene glycol (MIRALAX / GLYCOLAX) 17 g packet Take 17 g by mouth 2 (two) times daily as needed. 14 each 0  . Prenatal Vit-Fe Fumarate-FA (MULTIVITAMIN-PRENATAL) 27-0.8 MG TABS tablet Take 1 tablet by mouth daily at 12 noon.     No current facility-administered medications on file prior to visit.      Exam   Vitals:   11/28/19 1003  BP: 104/69  Pulse: 79  Weight: 183 lb (83 kg)   Fetal Heart Rate (bpm): 166  Uterus:   10 wk size  Pelvic Exam: Perineum: no hemorrhoids, normal perineum   Vulva: normal external genitalia, no lesions   Vagina:  normal mucosa, normal discharge   Cervix: no lesions and normal, pap smear done.    Adnexa: normal adnexa and no mass, fullness, tenderness   Bony Pelvis: average  System: General: well-developed, well-nourished female in no acute distress   Skin: normal coloration and turgor, no rashes   Neurologic: oriented, normal, negative, normal mood   Extremities: normal strength, tone, and muscle mass, ROM of all joints is normal   HEENT PERRLA, extraocular movement intact and sclera clear, anicteric   Mouth/Teeth mucous membranes moist, pharynx normal without lesions and dental hygiene good   Neck supple and no masses   Cardiovascular: regular rate and rhythm   Respiratory:  no respiratory distress, normal breath sounds   Abdomen: soft, non-tender; bowel sounds normal; no masses,  no organomegaly     Assessment:   Pregnancy: 11/30/19 Patient Active Problem List   Diagnosis Date Noted  .  Supervision of high risk pregnancy in first trimester 11/28/2019  . Subchorionic hematoma in first trimester 11/02/2019  . Cervical insufficiency during pregnancy, antepartum 06/09/2019  . IUFD at less than 20 weeks of gestation 06/09/2019  . Rh negative state in antepartum period 05/06/2019  . Obesity in pregnancy 05/06/2019  . BMI 30s 05/06/2019  . Nausea and vomiting in pregnancy 05/06/2019    Limited OB u/s reveals SIUP with + FHR Plan:  1. Rh negative state in antepartum period S/p Rhogma with [facility]  2. Supervision of high risk pregnancy in first trimester New OB labs and genetic screen today - Obstetric Panel, Including HIV - Culture, OB Urine - Genetic Screening - Cytology - PAP - [place] MFM OB COMP + 14 WK; Future - Enroll Patient in  Babyscripts - Hepatitis C antibody - Cervicovaginal ancillary only - Hemoglobin A1c  3. Cervical insufficiency during pregnancy, antepartum Booked for cervical cerclage at 13 wks.  4. Subchorionic hematoma in first trimester, single or unspecified fetus No on-going bleeding  5. Nausea and vomiting in pregnancy Zofran prn--uses Miralax as needed  Initial labs drawn. Continue prenatal vitamins. Genetic Screening discussed, NIPS: ordered. Ultrasound discussed; fetal anatomic survey: requested. Problem list reviewed and updated. Routine obstetric precautions reviewed. Return in about 5 weeks (around 01/02/2020) for in person.

## 2019-11-29 LAB — OBSTETRIC PANEL, INCLUDING HIV
Basophils Absolute: 0 10*3/uL (ref 0.0–0.2)
Basos: 1 %
EOS (ABSOLUTE): 0.1 10*3/uL (ref 0.0–0.4)
Eos: 1 %
HIV Screen 4th Generation wRfx: NONREACTIVE
Hematocrit: 38.5 % (ref 34.0–46.6)
Hemoglobin: 13.1 g/dL (ref 11.1–15.9)
Hepatitis B Surface Ag: NEGATIVE
Immature Grans (Abs): 0 10*3/uL (ref 0.0–0.1)
Immature Granulocytes: 0 %
Lymphocytes Absolute: 2.7 10*3/uL (ref 0.7–3.1)
Lymphs: 36 %
MCH: 32 pg (ref 26.6–33.0)
MCHC: 34 g/dL (ref 31.5–35.7)
MCV: 94 fL (ref 79–97)
Monocytes Absolute: 0.6 10*3/uL (ref 0.1–0.9)
Monocytes: 8 %
Neutrophils Absolute: 4.1 10*3/uL (ref 1.4–7.0)
Neutrophils: 54 %
Platelets: 267 10*3/uL (ref 150–450)
RBC: 4.1 x10E6/uL (ref 3.77–5.28)
RDW: 13.5 % (ref 11.7–15.4)
RPR Ser Ql: NONREACTIVE
Rh Factor: NEGATIVE
Rubella Antibodies, IGG: 3.1 index (ref >0.99)
WBC: 7.5 10*3/uL (ref 3.4–10.8)

## 2019-11-29 LAB — CERVICOVAGINAL ANCILLARY ONLY
Bacterial Vaginitis (gardnerella): NEGATIVE
Candida Glabrata: NEGATIVE
Candida Vaginitis: NEGATIVE
Comment: NEGATIVE
Comment: NEGATIVE
Comment: NEGATIVE

## 2019-11-29 LAB — HEPATITIS C ANTIBODY: Hep C Virus Ab: <0.1 s/co ratio (ref 0.0–0.9)

## 2019-11-29 LAB — AB SCR+ANTIBODY ID: Antibody Screen: POSITIVE — AB

## 2019-11-29 LAB — CYTOLOGY - PAP
Chlamydia: NEGATIVE
Comment: NEGATIVE
Comment: NORMAL
Diagnosis: NEGATIVE
Neisseria Gonorrhea: NEGATIVE

## 2019-11-29 LAB — HEMOGLOBIN A1C
Est. average glucose Bld gHb Est-mCnc: 105 mg/dL
Hgb A1c MFr Bld: 5.3 % (ref 4.8–5.6)

## 2019-11-30 LAB — URINE CULTURE, OB REFLEX

## 2019-11-30 LAB — CULTURE, OB URINE

## 2019-12-04 ENCOUNTER — Other Ambulatory Visit: Payer: Self-pay

## 2019-12-04 ENCOUNTER — Inpatient Hospital Stay (HOSPITAL_COMMUNITY)
Admission: AD | Admit: 2019-12-04 | Discharge: 2019-12-04 | Disposition: A | Discharge status: Home / Self Care | Payer: Medicaid Other | Attending: Obstetrics & Gynecology | Admitting: Obstetrics & Gynecology

## 2019-12-04 ENCOUNTER — Encounter (HOSPITAL_COMMUNITY): Payer: Self-pay | Admitting: Obstetrics & Gynecology

## 2019-12-04 DIAGNOSIS — O200 Threatened abortion: Secondary | ICD-10-CM | POA: Diagnosis not present

## 2019-12-04 DIAGNOSIS — R109 Unspecified abdominal pain: Secondary | ICD-10-CM | POA: Diagnosis present

## 2019-12-04 DIAGNOSIS — Z3A10 10 weeks gestation of pregnancy: Secondary | ICD-10-CM

## 2019-12-04 DIAGNOSIS — O4691 Antepartum hemorrhage, unspecified, first trimester: Secondary | ICD-10-CM | POA: Diagnosis not present

## 2019-12-04 DIAGNOSIS — Z3491 Encounter for supervision of normal pregnancy, unspecified, first trimester: Secondary | ICD-10-CM

## 2019-12-04 DIAGNOSIS — O209 Hemorrhage in early pregnancy, unspecified: Secondary | ICD-10-CM

## 2019-12-04 NOTE — MAU Note (Signed)
Theresa Fletcher is a 24 y.o. at [redacted]w[redacted]d here in MAU reporting: woke up around 0100 with cramping. Then went back to sleep and when she woke up at 0700 she was "soaked in blood". States she has gone through 3 panty liners this morning. No clots. Scheduled for cerclage next week. States she is feeling a lot of pressure. Last IC was last night.  Onset of complaint: this morning  Pain score: 8/10  Vitals:   12/04/19 0850  BP: 112/64  Pulse: 91  Resp: 17  Temp: 98.4 F (36.9 C)  SpO2: 98%     FHT: 168  Lab orders placed from triage: UA

## 2019-12-04 NOTE — MAU Provider Note (Signed)
Chief Complaint: Abdominal Pain and Vaginal Bleeding   First Provider Initiated Contact with Patient 12/04/19 0921     SUBJECTIVE HPI: Theresa Fletcher is a 24 y.o. G3P1011 at [redacted]w[redacted]d who presents to [facility] Admissions reporting abdominal cramping & vaginal bleeding. Abdominal cramping started last night. Vaginal bleeding started this morning. States she bled through her underwear this morning & filled up 3 panty liners. Not passing clots. Bleeding has slowed down. No recent intercourse. Had SCH on ultrasound at 6 weeks; received rhogam in MAU during that visit. Has had no bleeding episodes since then. Scheduled for a cerclage next week due to history of 16 week delivery last year.   Location: abdomen Quality: cramping Severity: 8/10 on pain scale Duration: 1 day Timing: intermittent Modifying factors: none Associated signs and symptoms: vaginal bleeding  Past Medical History:  Diagnosis Date  . Anemia   . Breast lump on right side at 10 o'clock position 03/12/2019  . Breast pain, right 03/12/2019   OB History  Gravida Para Term Preterm AB Living  3 1 1   1 1  SAB TAB Ectopic Multiple Live Births  1       1    # Outcome Date GA Lbr Len/2nd Weight Sex Delivery Anes PTL Lv  3 Current           2 Term 07/19/14   3204 g  Vag-Spont   LIV  1 SAB  [redacted]w[redacted]d      Y FD   Past Surgical History:  Procedure Laterality Date  . DILATION AND EVACUATION N/A 06/09/2019   Procedure: DILATATION AND EVACUATION;  Surgeon: Pickens, Charlie, MD;  Location: [facility];  Service: Gynecology;  Laterality: N/A;   Social History   Socioeconomic History  . Marital status: Single    Spouse name: Not on file  . Number of children: 1  . Years of education: Not on file  . Highest education level: 12th grade  Occupational History  . Not on file  Tobacco Use  . Smoking status: Never Smoker  . Smokeless tobacco: Never Used  Substance and Sexual Activity  . Alcohol use: Not Currently  . Drug use: Not  Currently  . Sexual activity: Yes    Birth control/protection: None  Other Topics Concern  . Not on file  Social History Narrative  . Not on file   Social Determinants of Health   Financial Resource Strain:   . Difficulty of Paying Living Expenses:   Food Insecurity:   . Worried About Running Out of Food in the Last Year:   . Ran Out of Food in the Last Year:   Transportation Needs: No Transportation Needs  . Lack of Transportation (Medical): No  . Lack of Transportation (Non-Medical): No  Physical Activity:   . Days of Exercise per Week:   . Minutes of Exercise per Session:   Stress:   . Feeling of Stress :   Social Connections:   . Frequency of Communication with Friends and Family:   . Frequency of Social Gatherings with Friends and Family:   . Attends Religious Services:   . Active Member of Clubs or Organizations:   . Attends Club or Organization Meetings:   . Marital Status:   Intimate Partner Violence:   . Fear of Current or Ex-Partner:   . Emotionally Abused:   . Physically Abused:   . Sexually Abused:    Family History  Problem Relation Age of Onset  . Diabetes Maternal Grandmother   .   Hypertension Maternal Grandfather   . Healthy Mother   . Healthy Father    No current facility-administered medications on file prior to encounter.   Current Outpatient Medications on File Prior to Encounter  Medication Sig Dispense Refill  . Blood Pressure KIT 1 Device by Does not apply route once a week. To be monitored at home weekly 1 kit 0  . ondansetron (ZOFRAN) 4 MG tablet Take 1 tablet (4 mg total) by mouth every 6 (six) hours as needed for nausea or vomiting. 30 tablet 1  . polyethylene glycol (MIRALAX / GLYCOLAX) 17 g packet Take 17 g by mouth 2 (two) times daily as needed. 14 each 0  . Prenatal Vit-Fe Fumarate-FA (MULTIVITAMIN-PRENATAL) 27-0.8 MG TABS tablet Take 1 tablet by mouth daily at 12 noon.     No Known Allergies  I have reviewed patient's Past Medical  Hx, Surgical Hx, Family Hx, Social Hx, medications and allergies.   Review of Systems  Constitutional: Negative.   Gastrointestinal: Positive for abdominal pain. Negative for constipation, diarrhea, nausea and vomiting.  Genitourinary: Positive for vaginal bleeding.    OBJECTIVE Patient Vitals for the past 24 hrs:  BP Temp Temp src Pulse Resp SpO2 Height Weight  12/04/19 0949 - 98.1 F (36.7 C) Oral - - - - -  12/04/19 0850 112/64 98.4 F (36.9 C) Oral 91 17 98 % - -  12/04/19 2902 - - - - - - 5' (1.524 m) 84.1 kg   Constitutional: Well-developed, well-nourished female in no acute distress.  Cardiovascular: normal rate & rhythm, no murmur Respiratory: normal rate and effort. Lung sounds clear throughout GI: Abd soft, non-tender, Pos BS x 4. No guarding or rebound tenderness MS: Extremities nontender, no edema, normal ROM Neurologic: Alert and oriented x 4.  GU:     SPECULUM EXAM: NEFG, small amount of dark red blood in vault. No active bleeding. Cervix pink/smooth/not friable  BIMANUAL: No CMT. cervix closed/thick   LAB RESULTS No results found for this or any previous visit (from the past 24 hour(s)).  IMAGING No results found.  MAU COURSE Orders Placed This Encounter  Procedures  . Urinalysis, Routine w reflex microscopic  . Discharge patient   No orders of the defined types were placed in this encounter.   MDM RH negative - received rhogam on 2/27  FHT present via doppler  No active bleeding on exam. Cervix closed/thick. Vag bleeding d/t [facility] vs threatened miscarriage. Discussed worsening symptoms & reasons to return  ASSESSMENT 1. Threatened miscarriage   2. Vaginal bleeding in pregnancy, first trimester   3. [redacted] weeks gestation of pregnancy   4. Fetal heart tones present, first trimester     PLAN Discharge home in stable condition. Pelvic rest Discussed reasons to return to MAU Keep f/u with ob   Allergies as of 12/04/2019   No Known Allergies      Medication List    TAKE these medications   Blood Pressure Kit 1 Device by Does not apply route once a week. To be monitored at home weekly   multivitamin-prenatal 27-0.8 MG Tabs tablet Take 1 tablet by mouth daily at 12 noon.   ondansetron 4 MG tablet Commonly known as: ZOFRAN Take 1 tablet (4 mg total) by mouth every 6 (six) hours as needed for nausea or vomiting.   polyethylene glycol 17 g packet Commonly known as: MIRALAX / GLYCOLAX Take 17 g by mouth 2 (two) times daily as needed.  , , NP 12/04/2019  9:50 AM    

## 2019-12-04 NOTE — Discharge Instructions (Signed)
Threatened Miscarriage  A threatened miscarriage occurs when a woman has vaginal bleeding during the first 20 weeks of pregnancy but the pregnancy has not ended. If you have vaginal bleeding during this time, your health care provider will do tests to make sure you are still pregnant. If the tests show that you are still pregnant and that the developing baby (fetus) inside your uterus is still growing, your condition is considered a threatened miscarriage. A threatened miscarriage does not mean your pregnancy will end, but it does increase the risk of losing your pregnancy (complete miscarriage). What are the causes? The cause of this condition is usually not known. For women who go on to have a complete miscarriage, the most common cause is an abnormal number of chromosomes in the developing baby. Chromosomes are the structures inside cells that hold all of a person's genetic material. What increases the risk? The following lifestyle factors may increase your risk of a miscarriage in early pregnancy:  Smoking.  Drinking excessive amounts of alcohol or caffeine.  Recreational drug use. The following preexisting health conditions may increase your risk of a miscarriage in early pregnancy:  Polycystic ovary syndrome.  Uterine fibroids.  Infections.  Diabetes mellitus. What are the signs or symptoms? Symptoms of this condition include:  Vaginal bleeding.  Mild abdominal pain or cramps. How is this diagnosed? If you have bleeding with or without abdominal pain before 20 weeks of pregnancy, your health care provider will do tests to check whether you are still pregnant. These will include:  Ultrasound. This test uses sound waves to create images of the inside of your uterus. This allows your health care provider to look at your developing baby and other structures, such as your placenta.  Pelvic exam. This is an internal exam of your vagina and cervix.  Measurement of your baby's heart  rate.  Laboratory tests such as blood tests, urine tests, or swabs for infection You may be diagnosed with a threatened miscarriage if:  Ultrasound testing shows that you are still pregnant.  Your baby's heart rate is strong.  A pelvic exam shows that the opening between your uterus and your vagina (cervix) is closed.  Blood tests confirm that you are still pregnant. How is this treated? No treatments have been shown to prevent a threatened miscarriage from going on to a complete miscarriage. However, the right home care is important. Follow these instructions at home:  Get plenty of rest.  Do not have sex or use tampons if you have vaginal bleeding.  Do not douche.  Do not smoke or use recreational drugs.  Do not drink alcohol.  Avoid caffeine.  Keep all follow-up prenatal visits as told by your health care provider. This is important. Contact a health care provider if:  You have light vaginal bleeding or spotting while pregnant.  You have abdominal pain or cramping.  You have a fever. Get help right away if:  You have heavy vaginal bleeding.  You have blood clots coming from your vagina.  You pass tissue from your vagina.  You leak fluid, or you have a gush of fluid from your vagina.  You have severe low back pain or abdominal cramps.  You have fever, chills, and severe abdominal pain. Summary  A threatened miscarriage occurs when a woman has vaginal bleeding during the first 20 weeks of pregnancy but the pregnancy has not ended.  The cause of a threatened miscarriage is usually not known.  Symptoms of this condition may   include vaginal bleeding and mild abdominal pain or cramps.  No treatments have been shown to prevent a threatened miscarriage from going on to a complete miscarriage.  Keep all follow-up prenatal visits as told by your health care provider. This is important. This information is not intended to replace advice given to you by your health  care provider. Make sure you discuss any questions you have with your health care provider. Document Revised: 09/28/2017 Document Reviewed: 11/18/2016 Elsevier Patient Education  2020 Elsevier Inc.  

## 2019-12-09 ENCOUNTER — Telehealth: Payer: Self-pay | Admitting: Radiology

## 2019-12-09 ENCOUNTER — Encounter: Payer: Self-pay | Admitting: Radiology

## 2019-12-09 NOTE — Telephone Encounter (Signed)
Patient informed of Panorama results including sex of baby

## 2019-12-16 ENCOUNTER — Telehealth (HOSPITAL_COMMUNITY): Payer: Self-pay | Admitting: *Deleted

## 2019-12-16 NOTE — Telephone Encounter (Signed)
Preadmission screen  

## 2019-12-17 ENCOUNTER — Other Ambulatory Visit (HOSPITAL_COMMUNITY)
Admission: RE | Admit: 2019-12-17 | Discharge: 2019-12-17 | Disposition: A | Discharge status: Home / Self Care | Payer: Medicaid Other | Source: Ambulatory Visit | Attending: Family Medicine | Admitting: Family Medicine

## 2019-12-17 ENCOUNTER — Telehealth (HOSPITAL_COMMUNITY): Payer: Self-pay | Admitting: *Deleted

## 2019-12-17 ENCOUNTER — Encounter (HOSPITAL_COMMUNITY): Payer: Self-pay | Admitting: *Deleted

## 2019-12-17 DIAGNOSIS — Z01812 Encounter for preprocedural laboratory examination: Secondary | ICD-10-CM | POA: Insufficient documentation

## 2019-12-17 DIAGNOSIS — Z20822 Contact with and (suspected) exposure to covid-19: Secondary | ICD-10-CM | POA: Insufficient documentation

## 2019-12-17 LAB — SARS CORONAVIRUS 2 (TAT 6-24 HRS): SARS Coronavirus 2: NEGATIVE

## 2019-12-17 NOTE — Telephone Encounter (Signed)
NPO p MN.  Arrive at 1000. Dropped off at valet entrance to [facility].  Verbalized understanding.

## 2019-12-18 ENCOUNTER — Encounter (HOSPITAL_COMMUNITY): Payer: Self-pay | Admitting: Obstetrics and Gynecology

## 2019-12-19 ENCOUNTER — Observation Stay (HOSPITAL_COMMUNITY): Payer: Medicaid Other | Admitting: Anesthesiology

## 2019-12-19 ENCOUNTER — Encounter (HOSPITAL_COMMUNITY): Payer: Self-pay | Admitting: Obstetrics and Gynecology

## 2019-12-19 ENCOUNTER — Other Ambulatory Visit: Payer: Self-pay | Admitting: Obstetrics and Gynecology

## 2019-12-19 ENCOUNTER — Ambulatory Visit (HOSPITAL_COMMUNITY)
Admission: RE | Admit: 2019-12-19 | Discharge: 2019-12-19 | Disposition: A | Discharge status: Home / Self Care | Payer: Medicaid Other | Attending: Obstetrics and Gynecology | Admitting: Obstetrics and Gynecology

## 2019-12-19 ENCOUNTER — Encounter (HOSPITAL_COMMUNITY)
Admission: RE | Disposition: A | Discharge status: Home / Self Care | Payer: Self-pay | Source: Home / Self Care | Attending: Obstetrics and Gynecology

## 2019-12-19 ENCOUNTER — Other Ambulatory Visit: Payer: Self-pay

## 2019-12-19 DIAGNOSIS — Z3A13 13 weeks gestation of pregnancy: Secondary | ICD-10-CM | POA: Insufficient documentation

## 2019-12-19 DIAGNOSIS — O3431 Maternal care for cervical incompetence, first trimester: Secondary | ICD-10-CM | POA: Insufficient documentation

## 2019-12-19 DIAGNOSIS — O3430 Maternal care for cervical incompetence, unspecified trimester: Secondary | ICD-10-CM

## 2019-12-19 HISTORY — PX: CERVICAL CERCLAGE: SHX1329

## 2019-12-19 LAB — CBC
HCT: 35 % — ABNORMAL LOW (ref 36.0–46.0)
Hemoglobin: 11.8 g/dL — ABNORMAL LOW (ref 12.0–15.0)
MCH: 31.6 pg (ref 26.0–34.0)
MCHC: 33.7 g/dL (ref 30.0–36.0)
MCV: 93.8 fL (ref 80.0–100.0)
Platelets: 229 10*3/uL (ref 150–400)
RBC: 3.73 MIL/uL — ABNORMAL LOW (ref 3.87–5.11)
RDW: 13 % (ref 11.5–15.5)
WBC: 10.1 10*3/uL (ref 4.0–10.5)
nRBC: 0 % (ref 0.0–0.2)

## 2019-12-19 SURGERY — CERCLAGE, CERVIX, VAGINAL APPROACH
Anesthesia: Spinal

## 2019-12-19 MED ORDER — ACETAMINOPHEN 500 MG PO TABS
ORAL_TABLET | ORAL | Status: AC
  Filled 2019-12-19: qty 2

## 2019-12-19 MED ORDER — KETOROLAC TROMETHAMINE 30 MG/ML IJ SOLN
INTRAMUSCULAR | Status: AC
  Filled 2019-12-19: qty 1

## 2019-12-19 MED ORDER — PROGESTERONE 200 MG PO CAPS: 200.0000 mg | ORAL_CAPSULE | Freq: Every day | ORAL | 6 refills | Status: DC

## 2019-12-19 MED ORDER — FENTANYL CITRATE (PF) 100 MCG/2ML IJ SOLN
25.0000 ug | INTRAMUSCULAR | Status: DC | PRN
  Administered 2019-12-19: 25 ug via INTRAVENOUS

## 2019-12-19 MED ORDER — SODIUM CHLORIDE 0.9 % IR SOLN
Status: DC | PRN
  Administered 2019-12-19: 1

## 2019-12-19 MED ORDER — FENTANYL CITRATE (PF) 250 MCG/5ML IJ SOLN
INTRAMUSCULAR | Status: AC
  Filled 2019-12-19: qty 5

## 2019-12-19 MED ORDER — LACTATED RINGERS IV SOLN: INTRAVENOUS | Status: DC | PRN

## 2019-12-19 MED ORDER — ACETAMINOPHEN 500 MG PO TABS
1000.0000 mg | ORAL_TABLET | ORAL | Status: AC
  Administered 2019-12-19: 1000 mg via ORAL

## 2019-12-19 MED ORDER — LACTATED RINGERS IV SOLN: INTRAVENOUS | Status: DC

## 2019-12-19 MED ORDER — STERILE WATER FOR IRRIGATION IR SOLN
Status: DC | PRN
  Administered 2019-12-19: 1000 mL

## 2019-12-19 MED ORDER — FENTANYL CITRATE (PF) 100 MCG/2ML IJ SOLN
INTRAMUSCULAR | Status: AC
  Filled 2019-12-19: qty 2

## 2019-12-19 MED ORDER — KETOROLAC TROMETHAMINE 15 MG/ML IJ SOLN
15.0000 mg | INTRAMUSCULAR | Status: AC
  Administered 2019-12-19: 15 mg via INTRAVENOUS
  Filled 2019-12-19: qty 1

## 2019-12-19 MED ORDER — BUPIVACAINE IN DEXTROSE 0.75-8.25 % IT SOLN
INTRATHECAL | Status: DC | PRN
  Administered 2019-12-19: 1 mL via INTRATHECAL

## 2019-12-19 MED ORDER — LIDOCAINE HCL (PF) 1 % IJ SOLN
INTRAMUSCULAR | Status: AC
  Filled 2019-12-19: qty 5

## 2019-12-19 MED ORDER — SOD CITRATE-CITRIC ACID 500-334 MG/5ML PO SOLN
30.0000 mL | ORAL | Status: AC
  Administered 2019-12-19: 30 mL via ORAL

## 2019-12-19 MED ORDER — SOD CITRATE-CITRIC ACID 500-334 MG/5ML PO SOLN
ORAL | Status: AC
  Filled 2019-12-19: qty 30

## 2019-12-19 SURGICAL SUPPLY — 17 items
CANISTER SUCT 3000ML PPV (MISCELLANEOUS) ×3 IMPLANT
CATH ROBINSON RED A/P 16FR (CATHETERS) ×3 IMPLANT
GLOVE BIO SURGEON STRL SZ7.5 (GLOVE) ×6 IMPLANT
GOWN STRL REUS W/TWL LRG LVL3 (GOWN DISPOSABLE) ×3 IMPLANT
GOWN STRL REUS W/TWL XL LVL3 (GOWN DISPOSABLE) ×3 IMPLANT
PACK VAGINAL MINOR WOMEN LF (CUSTOM PROCEDURE TRAY) ×3 IMPLANT
PAD OB MATERNITY 4.3X12.25 (PERSONAL CARE ITEMS) ×3 IMPLANT
PAD PREP 24X48 CUFFED NSTRL (MISCELLANEOUS) ×3 IMPLANT
SUT ETHIBOND 0 (SUTURE) ×3 IMPLANT
SUT MERSILENE FIBER S 5 CTX 12 (SUTURE) ×3 IMPLANT
SUT SILK 2 0 SH (SUTURE) ×3 IMPLANT
SYR BULB IRRIGATION 50ML (SYRINGE) ×3 IMPLANT
TOWEL OR 17X24 6PK STRL BLUE (TOWEL DISPOSABLE) ×6 IMPLANT
TRAY FOLEY W/BAG SLVR 14FR (SET/KITS/TRAYS/PACK) ×3 IMPLANT
TUBING NON-CON 1/4 X 20 CONN (TUBING) ×2 IMPLANT
TUBING NON-CON 1/4 X 20' CONN (TUBING) ×1
YANKAUER SUCT BULB TIP NO VENT (SUCTIONS) ×3 IMPLANT

## 2019-12-19 NOTE — Anesthesia Preprocedure Evaluation (Addendum)
Anesthesia Evaluation  Patient identified by MRN, date of birth, ID band Patient awake    Reviewed: Allergy & Precautions, NPO status , Patient's Chart, lab work & pertinent test results  Airway Mallampati: II  TM Distance: >3 FB Neck ROM: Full    Dental no notable dental hx. (+) Teeth Intact, Dental Advisory Given   Pulmonary neg pulmonary ROS,    Pulmonary exam normal breath sounds clear to auscultation       Cardiovascular negative cardio ROS Normal cardiovascular exam Rhythm:Regular Rate:Normal     Neuro/Psych negative neurological ROS  negative psych ROS   GI/Hepatic negative GI ROS, Neg liver ROS,   Endo/Other  negative endocrine ROS  Renal/GU negative Renal ROS  negative genitourinary   Musculoskeletal negative musculoskeletal ROS (+)   Abdominal   Peds  Hematology negative hematology ROS (+)   Anesthesia Other Findings   Reproductive/Obstetrics (+) Pregnancy                            Anesthesia Physical Anesthesia Plan  ASA: II  Anesthesia Plan: Spinal   Post-op Pain Management:    Induction:   PONV Risk Score and Plan: Treatment may vary due to age or medical condition  Airway Management Planned: Natural Airway  Additional Equipment:   Intra-op Plan:   Post-operative Plan:   Informed Consent: I have reviewed the patients History and Physical, chart, labs and discussed the procedure including the risks, benefits and alternatives for the proposed anesthesia with the patient or authorized representative who has indicated his/her understanding and acceptance.     Dental advisory given  Plan Discussed with: CRNA  Anesthesia Plan Comments:         Anesthesia Quick Evaluation

## 2019-12-19 NOTE — Discharge Instructions (Signed)
Cervical Cerclage, Care After This sheet gives you information about how to care for yourself after your procedure. Your health care provider may also give you more specific instructions. If you have problems or questions, contact your health care provider. What can I expect after the procedure? After your procedure, it is common to have:  Cramping in your abdomen.  Mucus discharge from your vagina. This may last for several days.  Painful urination (dysuria).  Spotting, or small drops of blood coming from your vagina. Follow these instructions at home:  Medicines  Take over-the-counter and prescription medicines only as told by your health care provider.  Ask your health care provider if the medicine prescribed to you requires you to avoid driving or using heavy machinery. General instructions  If you are told to go on bed rest, follow instructions from your health care provider. You may need to be on bed rest for up to 3 days.  Keep track of your vaginal discharge and watch for any changes. If you notice changes, tell your health care provider.  Avoid physical activities and exercise until your health care provider approves. Ask your health care provider what activities are safe for you.  Do not douche or have sex until your health care provider says it is okay to do so.  Keep all follow-up visits, including prenatal visits, as told by your health care provider. This is important. ? Prenatal visits are all the care that you receive before the birth of your baby. ? You may also need an ultrasound.  You may be asked to have weekly visits to have your cervix checked. Contact a health care provider if you:  Have abnormal discharge from your vagina, such as clots.  Have a bad-smelling discharge from your vagina.  Develop a rash on your skin. This may look like redness and swelling.  Become light-headed or feel like you are going to faint.  Have abdominal pain that does not  get better with medicine.  Have nausea or vomiting that does not go away. Get help right away if you:  Have vaginal bleeding that is heavier or more frequent than spotting.  Are leaking fluid or your water breaks.  Have a fever or chills.  Faint.  Have uterine contractions. These may feel like: ? A back ache. ? Lower abdominal pain. ? Mild cramps, similar to menstrual cramps. ? Tightening or pressure in your abdomen.  Think that your baby is not moving as much as usual, or you cannot feel your baby move.  Have chest pain.  Have shortness of breath. Summary  After the procedure, it is common to have cramping, vaginal discharge, painful urination, and small drops of blood coming from your vagina.  If you are told to go on bed rest, follow instructions from your health care provider. You may need to be on bed rest for up to 3 days.  Keep track of your vaginal discharge and watch for any changes. If you notice changes, tell your health care provider.  Contact a health care provider if you have abnormal vaginal discharge, become light-headed, or have pain that cannot be controlled with medicines.  Get help right away if you have heavy vaginal bleeding, your water breaks, or you have uterine contractions. Also, get help right away if your baby is not moving as much as usual, or you have chest pain or shortness of breath. This information is not intended to replace advice given to you by your health care provider.   Make sure you discuss any questions you have with your health care provider. Document Revised: 06/18/2019 Document Reviewed: 04/17/2019 Elsevier Patient Education  2020 Elsevier Inc.  

## 2019-12-19 NOTE — Transfer of Care (Signed)
Immediate Anesthesia Transfer of Care Note  Patient: Theresa Fletcher  Procedure(s) Performed: CERCLAGE CERVICAL (N/A )  Patient Location: PACU  Anesthesia Type:Spinal  Level of Consciousness: awake, alert  and oriented  Airway & Oxygen Therapy: Patient Spontanous Breathing  Post-op Assessment: Report given to RN and Post -op Vital signs reviewed and stable  Post vital signs: Reviewed and stable HR 78, RR 15, SaO2 100%, BP 100/65  Last Vitals:  Vitals Value Taken Time  BP    Temp    Pulse    Resp    SpO2      Last Pain:  Vitals:   12/19/19 1037  TempSrc: Oral         Complications: No apparent anesthesia complications

## 2019-12-19 NOTE — Anesthesia Procedure Notes (Addendum)
Spinal  Patient location during procedure: OR Start time: 12/19/2019 11:44 AM End time: 12/19/2019 11:51 AM Staffing Performed: anesthesiologist  Anesthesiologist: Elmer Picker, MD Preanesthetic Checklist Completed: patient identified, IV checked, risks and benefits discussed, surgical consent, monitors and equipment checked, pre-op evaluation and timeout performed Spinal Block Patient position: sitting Prep: DuraPrep and site prepped and draped Patient monitoring: cardiac monitor, continuous pulse ox and blood pressure Approach: midline Location: L3-4 Injection technique: single-shot Needle Needle type: Pencan  Needle gauge: 24 G Needle length: 9 cm Assessment Sensory level: T6 Additional Notes Functioning IV was confirmed and monitors were applied. Sterile prep and drape, including hand hygiene and sterile gloves were used. The patient was positioned and the spine was prepped. The skin was anesthetized with lidocaine.  Free flow of clear CSF was obtained prior to injecting local anesthetic into the CSF.  The spinal needle aspirated freely following injection.  The needle was carefully withdrawn.  The patient tolerated the procedure well.

## 2019-12-19 NOTE — Op Note (Signed)
Preoperative diagnosis: IUP 13 2/7 weeks with History of Incompetent cervix   Postoperative diagnosis: Same  Procedure: Cervical cerclage  Surgeon: Casimiro Fletcher L. Alysia Penna, M.D.  Anesthesia: Spinal  Findings: Dilated cervix to @ 1 cm  Estimated blood loss: < 5 cc  Specimens: None  Reason for procedure: Theresa Fletcher [id number] 13w 2/7 with history as noted above.  Procedure: Patient was taken to the operating room where spinal analgesia was administered. She was prepped and draped in the usual sterile fashion.  A Foley catheter is used to drain her bladder. A timeout was performed.  The patient was in dorsal lithotomy.  A weighted speculum was placed inside the vagina.  A Deaver was used anteriorly. The cervix was grasped with an open ring  forcep. A 5 mm Mersilene band interlaced with 0 Ethibond was placed in a pursestring fashion. Starting and ending at 12 o'clock.   The Mersilene was tied down. The knot was secured with 2/0 silk.  The Ethibond was then tied down as well to the point where a red rubber cathter would not place through the endocervical cannel. The Mersilene knot then was secured with a silk suture to prevent the knot from backing out.  All instrument, Fletcher and lap counts were correct x 2. The patient was taken to recovery in stable condition.  Theresa Staggers MD 12/19/2019 12:40 PM

## 2019-12-19 NOTE — H&P (Signed)
Theresa Fletcher is a 24 y.o.G3P1011 female IUP 13 2/7 weeks presenting for cerclage placement due to H/O incompetent cervix. .  OB History    Gravida  3   Para  1   Term  1   Preterm      AB  1   Living  1     SAB  1   TAB      Ectopic      Multiple      Live Births  1          Past Medical History:  Diagnosis Date  . Anemia   . Breast lump on right side at 10 o'clock position 03/12/2019  . Breast pain, right 03/12/2019   Past Surgical History:  Procedure Laterality Date  . DILATION AND EVACUATION N/A 06/09/2019   Procedure: DILATATION AND EVACUATION;  Surgeon: [place] Bing, MD;  Location: [facility] LD ORS;  Service: Gynecology;  Laterality: N/A;   Family History: family history includes Diabetes in her maternal grandmother; Healthy in her father and mother; Hypertension in her maternal grandfather. Social History:  reports that she has never smoked. She has never used smokeless tobacco. She reports previous alcohol use. She reports previous drug use.      Review of Systems  Constitutional: Negative.   Respiratory: Negative.   Cardiovascular: Negative.   Gastrointestinal: Negative.   Genitourinary: Negative.    History   Last menstrual period 09/19/2019, unknown if currently breastfeeding. Exam Physical Exam  Constitutional: She appears well-developed and well-nourished.  Cardiovascular: Normal rate and regular rhythm.  Respiratory: Effort normal and breath sounds normal.  GI: Soft. Bowel sounds are normal.  Genitourinary:    Genitourinary Comments: Deferred to OR     Prenatal labs: ABO, Rh: O/Negative/-- (03/25 1047) Antibody: Positive, See Final Results (03/25 1047) Rubella: 3.10 (03/25 1047) RPR: Non Reactive (03/25 1047)  HBsAg: Negative (03/25 1047)  HIV: Non Reactive (03/25 1047)  GBS:     Assessment/Plan: IUP 13 2/7 weeks H/O Incompetent cervix   For vaginal cerclage placement today. R/B/Post care reviewed with pt. Pt agrees to  proceed.   Hermina Staggers 12/19/2019, 10:19 AM

## 2019-12-20 NOTE — Anesthesia Postprocedure Evaluation (Signed)
Anesthesia Post Note  Patient: Theresa Fletcher  Procedure(s) Performed: CERCLAGE CERVICAL (N/A )     Patient location during evaluation: PACU Anesthesia Type: Spinal Level of consciousness: oriented and awake and alert Pain management: pain level controlled Vital Signs Assessment: post-procedure vital signs reviewed and stable Respiratory status: spontaneous breathing, respiratory function stable and patient connected to nasal cannula oxygen Cardiovascular status: blood pressure returned to baseline and stable Postop Assessment: no headache, no backache and no apparent nausea or vomiting Anesthetic complications: no    Last Vitals:  Vitals:   12/19/19 1515 12/19/19 1530  BP:  122/68  Pulse:  98  Resp: 20 19  Temp:  (!) 36.4 C  SpO2:  100%    Last Pain:  Vitals:   12/19/19 1530  TempSrc: Oral  PainSc: 0-No pain                 Baylynn Shifflett L Mazel Villela

## 2020-01-02 ENCOUNTER — Encounter: Payer: Medicaid Other | Admitting: Obstetrics and Gynecology

## 2020-01-07 ENCOUNTER — Other Ambulatory Visit: Payer: Self-pay

## 2020-01-07 ENCOUNTER — Ambulatory Visit (HOSPITAL_COMMUNITY): Payer: Medicaid Other | Attending: Obstetrics and Gynecology

## 2020-01-07 ENCOUNTER — Ambulatory Visit: Payer: Medicaid Other | Admitting: *Deleted

## 2020-01-07 VITALS — BP 119/63 | HR 105 | Temp 98.1°F

## 2020-01-07 DIAGNOSIS — O321XX0 Maternal care for breech presentation, not applicable or unspecified: Secondary | ICD-10-CM

## 2020-01-07 DIAGNOSIS — O99212 Obesity complicating pregnancy, second trimester: Secondary | ICD-10-CM

## 2020-01-07 DIAGNOSIS — O3432 Maternal care for cervical incompetence, second trimester: Secondary | ICD-10-CM

## 2020-01-07 DIAGNOSIS — Z3A15 15 weeks gestation of pregnancy: Secondary | ICD-10-CM

## 2020-01-07 DIAGNOSIS — E669 Obesity, unspecified: Secondary | ICD-10-CM

## 2020-01-07 DIAGNOSIS — Z3686 Encounter for antenatal screening for cervical length: Secondary | ICD-10-CM | POA: Diagnosis not present

## 2020-01-07 DIAGNOSIS — O3430 Maternal care for cervical incompetence, unspecified trimester: Secondary | ICD-10-CM

## 2020-01-15 ENCOUNTER — Other Ambulatory Visit: Payer: Self-pay

## 2020-01-15 ENCOUNTER — Ambulatory Visit (INDEPENDENT_AMBULATORY_CARE_PROVIDER_SITE_OTHER): Payer: Medicaid Other | Admitting: Advanced Practice Midwife

## 2020-01-15 VITALS — BP 106/69 | HR 105 | Wt 183.0 lb

## 2020-01-15 DIAGNOSIS — O3432 Maternal care for cervical incompetence, second trimester: Secondary | ICD-10-CM

## 2020-01-15 DIAGNOSIS — O26899 Other specified pregnancy related conditions, unspecified trimester: Secondary | ICD-10-CM

## 2020-01-15 DIAGNOSIS — Z6791 Unspecified blood type, Rh negative: Secondary | ICD-10-CM

## 2020-01-15 DIAGNOSIS — O0992 Supervision of high risk pregnancy, unspecified, second trimester: Secondary | ICD-10-CM

## 2020-01-15 NOTE — Progress Notes (Signed)
   PRENATAL VISIT NOTE  Subjective:  Theresa Fletcher is a 24 y.o. G3P1011 at [redacted]w[redacted]d being seen today for ongoing prenatal care.  She is currently monitored for the following issues for this high-risk pregnancy and has Rh negative state in antepartum period; Obesity in pregnancy; BMI 30s; Nausea and vomiting in pregnancy; Cervical insufficiency during pregnancy, antepartum; Subchorionic hematoma in first trimester; and Supervision of high risk pregnancy in first trimester on their problem list.  Patient reports no complaints.  Contractions: Not present. Vag. Bleeding: None.  Movement: Present. Denies leaking of fluid.   The following portions of the patient's history were reviewed and updated as appropriate: allergies, current medications, past family history, past medical history, past social history, past surgical history and problem list. Problem list updated.  Objective:   Vitals:   01/15/20 1455  BP: 106/69  Pulse: (!) 105  Weight: 183 lb (83 kg)    Fetal Status: Fetal Heart Rate (bpm): 145   Movement: Present     General:  Alert, oriented and cooperative. Patient is in no acute distress.  Skin: Skin is warm and dry. No rash noted.   Cardiovascular: Normal heart rate noted  Respiratory: Normal respiratory effort, no problems with respiration noted  Abdomen: Soft, gravid, appropriate for gestational age.  Pain/Pressure: Present     Pelvic: Cervical exam deferred        Extremities: Normal range of motion.  Edema: None  Mental Status: Normal mood and affect. Normal behavior. Normal judgment and thought content.   Assessment and Plan:  Pregnancy: G3P1011 at [redacted]w[redacted]d  1. Supervision of high risk pregnancy in second trimester - No concerning findings today  2. Cervical cerclage suture present in second trimester - S/p cerclage placement 12/18/2019 - Vaginal progesterone at bedtime. Ok to do in morning since working third shift and sleeping during the day  3. Rh negative state in  antepartum period S/p Rhogam 11/02/2019  Preterm labor symptoms and general obstetric precautions including but not limited to vaginal bleeding, contractions, leaking of fluid and fetal movement were reviewed in detail with the patient. Please refer to After Visit Summary for other counseling recommendations.  No follow-ups on file.  Future Appointments  Date Time Provider Department Center  01/30/2020 10:45 AM [facility] US5 [facility]  02/13/2020  3:00 PM [place] Bing, MD [facility]    [place], [place]

## 2020-01-15 NOTE — Patient Instructions (Signed)

## 2020-01-21 ENCOUNTER — Other Ambulatory Visit: Payer: Self-pay

## 2020-01-21 ENCOUNTER — Ambulatory Visit: Payer: Medicaid Other

## 2020-01-21 ENCOUNTER — Other Ambulatory Visit (HOSPITAL_COMMUNITY)
Admission: RE | Admit: 2020-01-21 | Discharge: 2020-01-21 | Disposition: A | Discharge status: Home / Self Care | Payer: Medicaid Other | Source: Ambulatory Visit | Attending: Obstetrics & Gynecology | Admitting: Obstetrics & Gynecology

## 2020-01-21 VITALS — BP 119/78 | HR 78

## 2020-01-21 DIAGNOSIS — N898 Other specified noninflammatory disorders of vagina: Secondary | ICD-10-CM | POA: Diagnosis not present

## 2020-01-21 DIAGNOSIS — B9689 Other specified bacterial agents as the cause of diseases classified elsewhere: Secondary | ICD-10-CM

## 2020-01-21 NOTE — Progress Notes (Signed)
SUBJECTIVE:  24 y.o. female complains of vaginal discharge for  Denies abnormal vaginal bleeding or significant pelvic pain or fever. No UTI symptoms. Denies history of known exposure to STD.  Patient's last menstrual period was 09/19/2019 (exact date).She is currently pregnant [redacted] wk 5d  OBJECTIVE:  She appears well, afebrile. Urine dipstick: none  ASSESSMENT:  Vaginal Discharge: brownish color  Vaginal Odor: yes   PLAN:  GC, chlamydia, trichomonas, BVAG, CVAG probe sent to lab. Treatment: To be determined once lab results are received ROV prn if symptoms persist or worsen.

## 2020-01-22 LAB — CERVICOVAGINAL ANCILLARY ONLY
Bacterial Vaginitis (gardnerella): POSITIVE — AB
Candida Glabrata: NEGATIVE
Candida Vaginitis: NEGATIVE
Chlamydia: NEGATIVE
Comment: NEGATIVE
Comment: NEGATIVE
Comment: NEGATIVE
Comment: NEGATIVE
Comment: NEGATIVE
Comment: NORMAL
Neisseria Gonorrhea: NEGATIVE
Trichomonas: NEGATIVE

## 2020-01-23 MED ORDER — METRONIDAZOLE 500 MG PO TABS: 500.0000 mg | ORAL_TABLET | Freq: Two times a day (BID) | ORAL | 0 refills | Status: DC

## 2020-01-23 NOTE — Addendum Note (Signed)
Addended by: Jaynie Collins A on: 01/23/2020 09:21 AM   Modules accepted: Orders

## 2020-01-30 ENCOUNTER — Other Ambulatory Visit: Payer: Self-pay | Admitting: *Deleted

## 2020-01-30 ENCOUNTER — Other Ambulatory Visit: Payer: Self-pay

## 2020-01-30 ENCOUNTER — Ambulatory Visit: Payer: Medicaid Other | Attending: Obstetrics and Gynecology

## 2020-01-30 DIAGNOSIS — E669 Obesity, unspecified: Secondary | ICD-10-CM

## 2020-01-30 DIAGNOSIS — Z363 Encounter for antenatal screening for malformations: Secondary | ICD-10-CM | POA: Diagnosis not present

## 2020-01-30 DIAGNOSIS — O99212 Obesity complicating pregnancy, second trimester: Secondary | ICD-10-CM | POA: Diagnosis not present

## 2020-01-30 DIAGNOSIS — O3432 Maternal care for cervical incompetence, second trimester: Secondary | ICD-10-CM | POA: Diagnosis not present

## 2020-01-30 DIAGNOSIS — Z3A19 19 weeks gestation of pregnancy: Secondary | ICD-10-CM

## 2020-01-30 DIAGNOSIS — O0991 Supervision of high risk pregnancy, unspecified, first trimester: Secondary | ICD-10-CM | POA: Diagnosis not present

## 2020-01-30 DIAGNOSIS — Z362 Encounter for other antenatal screening follow-up: Secondary | ICD-10-CM

## 2020-02-02 ENCOUNTER — Other Ambulatory Visit: Payer: Self-pay

## 2020-02-02 DIAGNOSIS — N760 Acute vaginitis: Secondary | ICD-10-CM

## 2020-02-04 MED ORDER — METRONIDAZOLE 500 MG PO TABS: 500.0000 mg | ORAL_TABLET | Freq: Two times a day (BID) | ORAL | 0 refills | Status: DC

## 2020-02-05 ENCOUNTER — Other Ambulatory Visit: Payer: Self-pay | Admitting: Family Medicine

## 2020-02-05 DIAGNOSIS — O0991 Supervision of high risk pregnancy, unspecified, first trimester: Secondary | ICD-10-CM

## 2020-02-13 ENCOUNTER — Ambulatory Visit (INDEPENDENT_AMBULATORY_CARE_PROVIDER_SITE_OTHER): Payer: Medicaid Other | Admitting: Obstetrics and Gynecology

## 2020-02-13 ENCOUNTER — Other Ambulatory Visit: Payer: Self-pay

## 2020-02-13 VITALS — BP 112/75 | HR 82 | Wt 186.0 lb

## 2020-02-13 DIAGNOSIS — O0990 Supervision of high risk pregnancy, unspecified, unspecified trimester: Secondary | ICD-10-CM

## 2020-02-13 DIAGNOSIS — Z6791 Unspecified blood type, Rh negative: Secondary | ICD-10-CM

## 2020-02-13 DIAGNOSIS — O0992 Supervision of high risk pregnancy, unspecified, second trimester: Secondary | ICD-10-CM

## 2020-02-13 DIAGNOSIS — O360120 Maternal care for anti-D [Rh] antibodies, second trimester, not applicable or unspecified: Secondary | ICD-10-CM

## 2020-02-13 DIAGNOSIS — O3430 Maternal care for cervical incompetence, unspecified trimester: Secondary | ICD-10-CM | POA: Insufficient documentation

## 2020-02-13 DIAGNOSIS — O3432 Maternal care for cervical incompetence, second trimester: Secondary | ICD-10-CM

## 2020-02-13 DIAGNOSIS — Z3A21 21 weeks gestation of pregnancy: Secondary | ICD-10-CM

## 2020-02-13 NOTE — Progress Notes (Signed)
Prenatal Visit Note Date: 02/13/2020 Clinic: [facility]  Subjective:  Theresa Fletcher is a 24 y.o. G3P1011 at [redacted]w[redacted]d being seen today for ongoing prenatal care.  She is currently monitored for the following issues for this high-risk pregnancy and has Rh negative state in antepartum period; Obesity in pregnancy; BMI 30s; Nausea and vomiting in pregnancy; Cervical insufficiency during pregnancy, antepartum; Subchorionic hematoma in first trimester; Supervision of high risk pregnancy, antepartum; and Cervical cerclage suture present on their problem list.  Patient reports d/c. pt thinks it may be BV. no bleeding.   Contractions: Not present. Vag. Bleeding: None.  Movement: Present. Denies leaking of fluid.   The following portions of the patient's history were reviewed and updated as appropriate: allergies, current medications, past family history, past medical history, past social history, past surgical history and problem list. Problem list updated.  Objective:   Vitals:   02/13/20 1450  BP: 112/75  Pulse: 82  Weight: 186 lb (84.4 kg)    Fetal Status: Fetal Heart Rate (bpm): 147   Movement: Present     General:  Alert, oriented and cooperative. Patient is in no acute distress.  Skin: Skin is warm and dry. No rash noted.   Cardiovascular: Normal heart rate noted  Respiratory: Normal respiratory effort, no problems with respiration noted  Abdomen: Soft, gravid, appropriate for gestational age. Pain/Pressure: Absent     Pelvic:  Cervical exam deferred        Extremities: Normal range of motion.  Edema: None  Mental Status: Normal mood and affect. Normal behavior. Normal judgment and thought content.   Urinalysis:      Assessment and Plan:  Pregnancy: G3P1011 at [redacted]w[redacted]d  1. Cervical cerclage suture present, antepartum She just finished the flagyl. 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 She is fine with  expectant management as long as no other s/s  Continue with qhs vag progesterone  2. Supervision of high risk pregnancy, antepartum   3. Rh negative state in antepartum period  Preterm labor symptoms and general obstetric precautions including but not limited to vaginal bleeding, contractions, leaking of fluid and fetal movement were reviewed in detail with the patient. Please refer to After Visit Summary for other counseling recommendations.  Return in about 3 weeks (around 03/05/2020) for in person or virtual, low risk.   [place] Bing, MD

## 2020-02-26 ENCOUNTER — Ambulatory Visit: Payer: Medicaid Other | Attending: Obstetrics and Gynecology

## 2020-02-26 ENCOUNTER — Other Ambulatory Visit: Payer: Self-pay | Admitting: Obstetrics and Gynecology

## 2020-02-26 ENCOUNTER — Ambulatory Visit: Payer: Medicaid Other | Admitting: *Deleted

## 2020-02-26 ENCOUNTER — Other Ambulatory Visit: Payer: Self-pay | Admitting: *Deleted

## 2020-02-26 ENCOUNTER — Other Ambulatory Visit: Payer: Self-pay

## 2020-02-26 VITALS — BP 120/71 | HR 95

## 2020-02-26 DIAGNOSIS — Z362 Encounter for other antenatal screening follow-up: Secondary | ICD-10-CM

## 2020-02-26 DIAGNOSIS — E669 Obesity, unspecified: Secondary | ICD-10-CM

## 2020-02-26 DIAGNOSIS — O0990 Supervision of high risk pregnancy, unspecified, unspecified trimester: Secondary | ICD-10-CM | POA: Diagnosis present

## 2020-02-26 DIAGNOSIS — O3432 Maternal care for cervical incompetence, second trimester: Secondary | ICD-10-CM | POA: Diagnosis present

## 2020-02-26 DIAGNOSIS — O99210 Obesity complicating pregnancy, unspecified trimester: Secondary | ICD-10-CM

## 2020-02-26 DIAGNOSIS — O99212 Obesity complicating pregnancy, second trimester: Secondary | ICD-10-CM

## 2020-02-26 DIAGNOSIS — Z3A22 22 weeks gestation of pregnancy: Secondary | ICD-10-CM

## 2020-02-26 NOTE — Progress Notes (Signed)
Pt reports cramping since 4 am, denies bleeding or leaking.

## 2020-02-27 ENCOUNTER — Encounter: Payer: Self-pay | Admitting: Obstetrics and Gynecology

## 2020-02-27 ENCOUNTER — Telehealth: Payer: Self-pay | Admitting: Radiology

## 2020-02-27 ENCOUNTER — Encounter: Payer: Medicaid Other | Admitting: Obstetrics and Gynecology

## 2020-02-27 NOTE — Telephone Encounter (Signed)
Left message for patient to call [facility] to reschedule missed appointment with Dr Vergie Living on 02/27/20

## 2020-02-28 NOTE — Progress Notes (Signed)
Patient did not keep her OB appointment for 02/27/2020.  Cornelia Copa MD Attending [facility] for [employer])

## 2020-03-03 ENCOUNTER — Ambulatory Visit (INDEPENDENT_AMBULATORY_CARE_PROVIDER_SITE_OTHER): Payer: Medicaid Other | Admitting: Family Medicine

## 2020-03-03 ENCOUNTER — Other Ambulatory Visit: Payer: Self-pay

## 2020-03-03 ENCOUNTER — Encounter: Payer: Medicaid Other | Admitting: Family Medicine

## 2020-03-03 ENCOUNTER — Other Ambulatory Visit (HOSPITAL_COMMUNITY)
Admission: RE | Admit: 2020-03-03 | Discharge: 2020-03-03 | Disposition: A | Discharge status: Home / Self Care | Payer: Medicaid Other | Source: Ambulatory Visit | Attending: Obstetrics and Gynecology | Admitting: Obstetrics and Gynecology

## 2020-03-03 VITALS — BP 108/70 | HR 101 | Wt 184.0 lb

## 2020-03-03 DIAGNOSIS — O0990 Supervision of high risk pregnancy, unspecified, unspecified trimester: Secondary | ICD-10-CM | POA: Diagnosis not present

## 2020-03-03 DIAGNOSIS — B9689 Other specified bacterial agents as the cause of diseases classified elsewhere: Secondary | ICD-10-CM | POA: Insufficient documentation

## 2020-03-03 DIAGNOSIS — N760 Acute vaginitis: Secondary | ICD-10-CM | POA: Diagnosis present

## 2020-03-03 DIAGNOSIS — O3430 Maternal care for cervical incompetence, unspecified trimester: Secondary | ICD-10-CM

## 2020-03-03 DIAGNOSIS — O0993 Supervision of high risk pregnancy, unspecified, third trimester: Secondary | ICD-10-CM

## 2020-03-03 DIAGNOSIS — B3731 Acute candidiasis of vulva and vagina: Secondary | ICD-10-CM

## 2020-03-03 DIAGNOSIS — O360130 Maternal care for anti-D [Rh] antibodies, third trimester, not applicable or unspecified: Secondary | ICD-10-CM

## 2020-03-03 DIAGNOSIS — Z6791 Unspecified blood type, Rh negative: Secondary | ICD-10-CM

## 2020-03-03 DIAGNOSIS — O3433 Maternal care for cervical incompetence, third trimester: Secondary | ICD-10-CM

## 2020-03-03 DIAGNOSIS — O4693 Antepartum hemorrhage, unspecified, third trimester: Secondary | ICD-10-CM

## 2020-03-03 DIAGNOSIS — B373 Candidiasis of vulva and vagina: Secondary | ICD-10-CM

## 2020-03-03 DIAGNOSIS — O26893 Other specified pregnancy related conditions, third trimester: Secondary | ICD-10-CM

## 2020-03-03 DIAGNOSIS — O99213 Obesity complicating pregnancy, third trimester: Secondary | ICD-10-CM

## 2020-03-03 MED ORDER — METRONIDAZOLE 500 MG PO TABS: 500.0000 mg | ORAL_TABLET | Freq: Two times a day (BID) | ORAL | 0 refills | Status: DC

## 2020-03-03 NOTE — Progress Notes (Signed)
° ° °  PRENATAL VISIT NOTE  Subjective:  Theresa Fletcher is a 24 y.o. G3P1011 at [redacted]w[redacted]d being seen today for ongoing prenatal care.  She is currently monitored for the following issues for this high-risk pregnancy and has Rh negative state in antepartum period; Obesity in pregnancy; BMI 30s; Nausea and vomiting in pregnancy; Cervical insufficiency during pregnancy, antepartum; Subchorionic hematoma in first trimester; Supervision of high risk pregnancy, antepartum; and Cervical cerclage suture present on their problem list.  Patient reports vaginal irritation and vaginal discahrge.  Contractions: Not present. Vag. Bleeding: None.  Movement: Absent. Denies leaking of fluid.   The following portions of the patient's history were reviewed and updated as appropriate: allergies, current medications, past family history, past medical history, past social history, past surgical history and problem list.   Objective:   Vitals:   03/03/20 1347  BP: 108/70  Pulse: (!) 101  Weight: 184 lb (83.5 kg)    Fetal Status: Fetal Heart Rate (bpm): 146   Movement: Absent     General:  Alert, oriented and cooperative. Patient is in no acute distress.  Skin: Skin is warm and dry. No rash noted.   Cardiovascular: Normal heart rate noted  Respiratory: Normal respiratory effort, no problems with respiration noted  Abdomen: Soft, gravid, appropriate for gestational age.  Pain/Pressure: Present     Pelvic: Cervical exam performed in the presence of a chaperone        Extremities: Normal range of motion.  Edema: None  Mental Status: Normal mood and affect. Normal behavior. Normal judgment and thought content.   Assessment and Plan:  Pregnancy: G3P1011 at [redacted]w[redacted]d 1. Supervision of high risk pregnancy, antepartum Continue prenatal care.  2. Rh negative state in antepartum period For Rhogam next visit  3. Cervical insufficiency during pregnancy, antepartum Cerclage in place Continue Proesterone  4. BV  (bacterial vaginosis) Likely BV--treat presumptively, check cultures - Cervicovaginal ancillary only - metroNIDAZOLE (FLAGYL) 500 MG tablet; Take 1 tablet (500 mg total) by mouth 2 (two) times daily.  Dispense: 14 tablet; Refill: 0 - Cervicovaginal ancillary only  Preterm labor symptoms and general obstetric precautions including but not limited to vaginal bleeding, contractions, leaking of fluid and fetal movement were reviewed in detail with the patient. Please refer to After Visit Summary for other counseling recommendations.   Return in 4 weeks (on 03/31/2020) for 28 wk labs.  Future Appointments  Date Time Provider Department Center  04/01/2020  8:15 AM Tereso Newcomer, MD [facility]  04/29/2020 12:45 PM [facility] NURSE [facility]  04/29/2020 12:45 PM [facility] US5 [facility]    Reva Bores, MD

## 2020-03-03 NOTE — Patient Instructions (Signed)

## 2020-03-03 NOTE — Progress Notes (Signed)
Has rash on labias since the weekend

## 2020-03-04 LAB — CERVICOVAGINAL ANCILLARY ONLY
Bacterial Vaginitis (gardnerella): NEGATIVE
Candida Glabrata: NEGATIVE
Candida Vaginitis: POSITIVE — AB
Chlamydia: NEGATIVE
Comment: NEGATIVE
Comment: NEGATIVE
Comment: NEGATIVE
Comment: NEGATIVE
Comment: NEGATIVE
Comment: NORMAL
Neisseria Gonorrhea: NEGATIVE
Trichomonas: NEGATIVE

## 2020-03-04 MED ORDER — TERCONAZOLE 0.8 % VA CREA: 1.0000 | TOPICAL_CREAM | Freq: Every day | VAGINAL | 0 refills | Status: DC

## 2020-03-04 NOTE — Addendum Note (Signed)
Addended by: Reva Bores on: 03/04/2020 03:06 PM   Modules accepted: Orders

## 2020-04-01 ENCOUNTER — Ambulatory Visit (INDEPENDENT_AMBULATORY_CARE_PROVIDER_SITE_OTHER): Payer: Medicaid Other | Admitting: Obstetrics & Gynecology

## 2020-04-01 ENCOUNTER — Encounter: Payer: Self-pay | Admitting: Obstetrics & Gynecology

## 2020-04-01 ENCOUNTER — Other Ambulatory Visit: Payer: Self-pay

## 2020-04-01 VITALS — BP 115/76 | HR 76 | Wt 184.6 lb

## 2020-04-01 DIAGNOSIS — Z23 Encounter for immunization: Secondary | ICD-10-CM

## 2020-04-01 DIAGNOSIS — O26892 Other specified pregnancy related conditions, second trimester: Secondary | ICD-10-CM | POA: Diagnosis not present

## 2020-04-01 DIAGNOSIS — Z6791 Unspecified blood type, Rh negative: Secondary | ICD-10-CM

## 2020-04-01 DIAGNOSIS — O0990 Supervision of high risk pregnancy, unspecified, unspecified trimester: Secondary | ICD-10-CM

## 2020-04-01 DIAGNOSIS — Z3A27 27 weeks gestation of pregnancy: Secondary | ICD-10-CM

## 2020-04-01 DIAGNOSIS — O3432 Maternal care for cervical incompetence, second trimester: Secondary | ICD-10-CM

## 2020-04-01 DIAGNOSIS — D509 Iron deficiency anemia, unspecified: Secondary | ICD-10-CM

## 2020-04-01 DIAGNOSIS — O360921 Maternal care for other rhesus isoimmunization, second trimester, fetus 1: Secondary | ICD-10-CM | POA: Diagnosis not present

## 2020-04-01 DIAGNOSIS — O99012 Anemia complicating pregnancy, second trimester: Secondary | ICD-10-CM

## 2020-04-01 DIAGNOSIS — O0992 Supervision of high risk pregnancy, unspecified, second trimester: Secondary | ICD-10-CM

## 2020-04-01 DIAGNOSIS — O3430 Maternal care for cervical incompetence, unspecified trimester: Secondary | ICD-10-CM

## 2020-04-01 DIAGNOSIS — Z302 Encounter for sterilization: Secondary | ICD-10-CM | POA: Insufficient documentation

## 2020-04-01 LAB — CBC
Hematocrit: 30.4 % — ABNORMAL LOW (ref 34.0–46.6)
Hemoglobin: 10 g/dL — ABNORMAL LOW (ref 11.1–15.9)
MCH: 31.1 pg (ref 26.6–33.0)
MCHC: 32.9 g/dL (ref 31.5–35.7)
MCV: 94 fL (ref 79–97)
Platelets: 242 10*3/uL (ref 150–450)
RBC: 3.22 x10E6/uL — ABNORMAL LOW (ref 3.77–5.28)
RDW: 13.8 % (ref 11.7–15.4)
WBC: 9.9 10*3/uL (ref 3.4–10.8)

## 2020-04-01 MED ORDER — RHO D IMMUNE GLOBULIN 1500 UNIT/2ML IJ SOSY
300.0000 ug | PREFILLED_SYRINGE | Freq: Once | INTRAMUSCULAR | Status: AC
  Administered 2020-04-01: 300 ug via INTRAMUSCULAR

## 2020-04-01 NOTE — Patient Instructions (Signed)
Return to office for any scheduled appointments. Call the office or go to the [facility] at [facility] [place] if:  You begin to have strong, frequent contractions  Your water breaks.  Sometimes it is a big gush of fluid, sometimes it is just a trickle that keeps getting your panties wet or running down your legs  You have vaginal bleeding.  It is normal to have a small amount of spotting if your cervix was checked.   You do not feel your baby moving like normal.  If you do not, get something to eat and drink and lay down and focus on feeling your baby move.   If your baby is still not moving like normal, you should call the office or go to MAU.  Any other obstetric concerns.   Third Trimester of Pregnancy The third trimester is from week 28 through week 40 (months 7 through 9). The third trimester is a time when the unborn baby (fetus) is growing rapidly. At the end of the ninth month, the fetus is about 20 inches in length and weighs 6-10 pounds. Body changes during your third trimester Your body will continue to go through many changes during pregnancy. The changes vary from woman to woman. During the third trimester:  Your weight will continue to increase. You can expect to gain 25-35 pounds (11-16 kg) by the end of the pregnancy.  You may begin to get stretch marks on your hips, abdomen, and breasts.  You may urinate more often because the fetus is moving lower into your pelvis and pressing on your bladder.  You may develop or continue to have heartburn. This is caused by increased hormones that slow down muscles in the digestive tract.  You may develop or continue to have constipation because increased hormones slow digestion and cause the muscles that push waste through your intestines to relax.  You may develop hemorrhoids. These are swollen veins (varicose veins) in the rectum that can itch or be painful.  You may develop swollen, bulging veins (varicose veins)  in your legs.  You may have increased body aches in the pelvis, back, or thighs. This is due to weight gain and increased hormones that are relaxing your joints.  You may have changes in your hair. These can include thickening of your hair, rapid growth, and changes in texture. Some women also have hair loss during or after pregnancy, or hair that feels dry or thin. Your hair will most likely return to normal after your baby is born.  Your breasts will continue to grow and they will continue to become tender. A yellow fluid (colostrum) may leak from your breasts. This is the first milk you are producing for your baby.  Your belly button may stick out.  You may notice more swelling in your hands, face, or ankles.  You may have increased tingling or numbness in your hands, arms, and legs. The skin on your belly may also feel numb.  You may feel short of breath because of your expanding uterus.  You may have more problems sleeping. This can be caused by the size of your belly, increased need to urinate, and an increase in your body's metabolism.  You may notice the fetus "dropping," or moving lower in your abdomen (lightening).  You may have increased vaginal discharge.  You may notice your joints feel loose and you may have pain around your pelvic bone. What to expect at prenatal visits You will have   prenatal exams every 2 weeks until week 36. Then you will have weekly prenatal exams. During a routine prenatal visit:  You will be weighed to make sure you and the baby are growing normally.  Your blood pressure will be taken.  Your abdomen will be measured to track your baby's growth.  The fetal heartbeat will be listened to.  Any test results from the previous visit will be discussed.  You may have a cervical check near your due date to see if your cervix has softened or thinned (effaced).  You will be tested for Group B streptococcus. This happens between 35 and 37 weeks. Your  health care provider may ask you:  What your birth plan is.  How you are feeling.  If you are feeling the baby move.  If you have had any abnormal symptoms, such as leaking fluid, bleeding, severe headaches, or abdominal cramping.  If you are using any tobacco products, including cigarettes, chewing tobacco, and electronic cigarettes.  If you have any questions. Other tests or screenings that may be performed during your third trimester include:  Blood tests that check for low iron levels (anemia).  Fetal testing to check the health, activity level, and growth of the fetus. Testing is done if you have certain medical conditions or if there are problems during the pregnancy.  Nonstress test (NST). This test checks the health of your baby to make sure there are no signs of problems, such as the baby not getting enough oxygen. During this test, a belt is placed around your belly. The baby is made to move, and its heart rate is monitored during movement. What is false labor? False labor is a condition in which you feel small, irregular tightenings of the muscles in the womb (contractions) that usually go away with rest, changing position, or drinking water. These are called Braxton Hicks contractions. Contractions may last for hours, days, or even weeks before true labor sets in. If contractions come at regular intervals, become more frequent, increase in intensity, or become painful, you should see your health care provider. What are the signs of labor?  Abdominal cramps.  Regular contractions that start at 10 minutes apart and become stronger and more frequent with time.  Contractions that start on the top of the uterus and spread down to the lower abdomen and back.  Increased pelvic pressure and dull back pain.  A watery or bloody mucus discharge that comes from the vagina.  Leaking of amniotic fluid. This is also known as your "water breaking." It could be a slow trickle or a gush.  Let your health care provider know if it has a color or strange odor. If you have any of these signs, call your health care provider right away, even if it is before your due date. Follow these instructions at home: Medicines  Follow your health care provider's instructions regarding medicine use. Specific medicines may be either safe or unsafe to take during pregnancy.  Take a prenatal vitamin that contains at least 600 micrograms (mcg) of folic acid.  If you develop constipation, try taking a stool softener if your health care provider approves. Eating and drinking   Eat a balanced diet that includes fresh fruits and vegetables, whole grains, good sources of protein such as meat, eggs, or tofu, and low-fat dairy. Your health care provider will help you determine the amount of weight gain that is right for you.  Avoid raw meat and uncooked cheese. These carry germs that   can cause birth defects in the baby.  If you have low calcium intake from food, talk to your health care provider about whether you should take a daily calcium supplement.  Eat four or five small meals rather than three large meals a day.  Limit foods that are high in fat and processed sugars, such as fried and sweet foods.  To prevent constipation: ? Drink enough fluid to keep your urine clear or pale yellow. ? Eat foods that are high in fiber, such as fresh fruits and vegetables, whole grains, and beans. Activity  Exercise only as directed by your health care provider. Most women can continue their usual exercise routine during pregnancy. Try to exercise for 30 minutes at least 5 days a week. Stop exercising if you experience uterine contractions.  Avoid heavy lifting.  Do not exercise in extreme heat or humidity, or at high altitudes.  Wear low-heel, comfortable shoes.  Practice good posture.  You may continue to have sex unless your health care provider tells you otherwise. Relieving pain and  discomfort  Take frequent breaks and rest with your legs elevated if you have leg cramps or low back pain.  Take warm sitz baths to soothe any pain or discomfort caused by hemorrhoids. Use hemorrhoid cream if your health care provider approves.  Wear a good support bra to prevent discomfort from breast tenderness.  If you develop varicose veins: ? Wear support pantyhose or compression stockings as told by your healthcare provider. ? Elevate your feet for 15 minutes, 3-4 times a day. Prenatal care  Write down your questions. Take them to your prenatal visits.  Keep all your prenatal visits as told by your health care provider. This is important. Safety  Wear your seat belt at all times when driving.  Make a list of emergency phone numbers, including numbers for family, friends, the hospital, and police and fire departments. General instructions  Avoid cat litter boxes and soil used by cats. These carry germs that can cause birth defects in the baby. If you have a cat, ask someone to clean the litter box for you.  Do not travel far distances unless it is absolutely necessary and only with the approval of your health care provider.  Do not use hot tubs, steam rooms, or saunas.  Do not drink alcohol.  Do not use any products that contain nicotine or tobacco, such as cigarettes and e-cigarettes. If you need help quitting, ask your health care provider.  Do not use any medicinal herbs or unprescribed drugs. These chemicals affect the formation and growth of the baby.  Do not douche or use tampons or scented sanitary pads.  Do not cross your legs for long periods of time.  To prepare for the arrival of your baby: ? Take prenatal classes to understand, practice, and ask questions about labor and delivery. ? Make a trial run to the hospital. ? Visit the hospital and tour the maternity area. ? Arrange for maternity or paternity leave through employers. ? Arrange for family and  friends to take care of pets while you are in the hospital. ? Purchase a rear-facing car seat and make sure you know how to install it in your car. ? Pack your hospital bag. ? Prepare the baby's nursery. Make sure to remove all pillows and stuffed animals from the baby's crib to prevent suffocation.  Visit your dentist if you have not gone during your pregnancy. Use a soft toothbrush to brush your teeth and be   gentle when you floss. Contact a health care provider if:  You are unsure if you are in labor or if your water has broken.  You become dizzy.  You have mild pelvic cramps, pelvic pressure, or nagging pain in your abdominal area.  You have lower back pain.  You have persistent nausea, vomiting, or diarrhea.  You have an unusual or bad smelling vaginal discharge.  You have pain when you urinate. Get help right away if:  Your water breaks before 37 weeks.  You have regular contractions less than 5 minutes apart before 37 weeks.  You have a fever.  You are leaking fluid from your vagina.  You have spotting or bleeding from your vagina.  You have severe abdominal pain or cramping.  You have rapid weight loss or weight gain.  You have shortness of breath with chest pain.  You notice sudden or extreme swelling of your face, hands, ankles, feet, or legs.  Your baby makes fewer than 10 movements in 2 hours.  You have severe headaches that do not go away when you take medicine.  You have vision changes. Summary  The third trimester is from week 28 through week 40, months 7 through 9. The third trimester is a time when the unborn baby (fetus) is growing rapidly.  During the third trimester, your discomfort may increase as you and your baby continue to gain weight. You may have abdominal, leg, and back pain, sleeping problems, and an increased need to urinate.  During the third trimester your breasts will keep growing and they will continue to become tender. A yellow  fluid (colostrum) may leak from your breasts. This is the first milk you are producing for your baby.  False labor is a condition in which you feel small, irregular tightenings of the muscles in the womb (contractions) that eventually go away. These are called Braxton Hicks contractions. Contractions may last for hours, days, or even weeks before true labor sets in.  Signs of labor can include: abdominal cramps; regular contractions that start at 10 minutes apart and become stronger and more frequent with time; watery or bloody mucus discharge that comes from the vagina; increased pelvic pressure and dull back pain; and leaking of amniotic fluid. This information is not intended to replace advice given to you by your health care provider. Make sure you discuss any questions you have with your health care provider. Document Revised: 12/13/2018 Document Reviewed: 09/27/2016 [employer] Patient Education  2020 [employer]  

## 2020-04-01 NOTE — Progress Notes (Signed)
   PRENATAL VISIT NOTE  Subjective:  Theresa Fletcher Needle is a 24 y.o. G3P1011 at [redacted]w[redacted]d being seen today for ongoing prenatal care.  She is currently monitored for the following issues for this high-risk pregnancy and has Rh negative state in antepartum period; Obesity in pregnancy; BMI 30s; Cervical insufficiency during pregnancy, antepartum; Subchorionic hematoma in first trimester; Supervision of high risk pregnancy, antepartum; Cervical cerclage suture present; and Request for sterilization on their problem list.  Patient reports some cramping.  Contractions: Not present.  .  Movement: Present. Denies leaking of fluid.   The following portions of the patient's history were reviewed and updated as appropriate: allergies, current medications, past family history, past medical history, past social history, past surgical history and problem list.   Objective:   Vitals:   04/01/20 0829  BP: 115/76  Pulse: 76  Weight: 184 lb 9.6 oz (83.7 kg)    Fetal Status: Fetal Heart Rate (bpm): 146   Movement: Present     General:  Alert, oriented and cooperative. Patient is in no acute distress.  Skin: Skin is warm and dry. No rash noted.   Cardiovascular: Normal heart rate noted  Respiratory: Normal respiratory effort, no problems with respiration noted  Abdomen: Soft, gravid, appropriate for gestational age.  Pain/Pressure: Absent     Pelvic: Cervical exam performed in the presence of a chaperone Dilation: Closed Effacement (%): Thick Station: Ballotable. Cerclage in place, not under tension.  Extremities: Normal range of motion.  Edema: None  Mental Status: Normal mood and affect. Normal behavior. Normal judgment and thought content.   Assessment and Plan:  Pregnancy: G3P1011 at [redacted]w[redacted]d 1. Rh negative state in antepartum period Rhogam given today, - rho (d) immune globulin (RHIG/RHOPHYLAC) injection 300 mcg  2. Cervical insufficiency during pregnancy, antepartum 3. Cervical cerclage suture  present in second trimester Closed cervix on exam.  Continue vaginal progesterone.   4. [redacted] weeks gestation of pregnancy 5. Supervision of high risk pregnancy, antepartum - HIV Antibody (routine testing w rflx) - CBC - RPR - Glucose Tolerance, 2 Hours w/1 Hour - Tdap vaccine greater than or equal to 7yo IM - Antibody screen Third trimester labs done today, will follow up results and manage accordingly. Desires BTL, counseled about risk of regret and permanence/irreversibility, she is insistent. Also told to tell her FOB to consider vasectomy.  Medicaid papers signed today.  Preterm labor symptoms and general obstetric precautions including but not limited to vaginal bleeding, contractions, leaking of fluid and fetal movement were reviewed in detail with the patient. Please refer to After Visit Summary for other counseling recommendations.   Return in about 2 weeks (around 04/15/2020) for OFFICE OB Visit.  Future Appointments  Date Time Provider Department Center  04/29/2020 12:45 PM [facility] NURSE [facility]  04/29/2020 12:45 PM [facility]    Jaynie Collins, MD

## 2020-04-02 LAB — ANTIBODY SCREEN: Antibody Screen: NEGATIVE

## 2020-04-02 LAB — HIV ANTIBODY (ROUTINE TESTING W REFLEX): HIV Screen 4th Generation wRfx: NONREACTIVE

## 2020-04-02 LAB — GLUCOSE TOLERANCE, 2 HOURS W/ 1HR
Glucose, 1 hour: 153 mg/dL (ref 65–179)
Glucose, 2 hour: 123 mg/dL (ref 65–152)
Glucose, Fasting: 80 mg/dL (ref 65–91)

## 2020-04-02 LAB — RPR: RPR Ser Ql: NONREACTIVE

## 2020-04-03 ENCOUNTER — Other Ambulatory Visit: Payer: Self-pay

## 2020-04-03 ENCOUNTER — Encounter: Payer: Self-pay | Admitting: Obstetrics & Gynecology

## 2020-04-03 DIAGNOSIS — D509 Iron deficiency anemia, unspecified: Secondary | ICD-10-CM | POA: Insufficient documentation

## 2020-04-03 DIAGNOSIS — B3731 Acute candidiasis of vulva and vagina: Secondary | ICD-10-CM

## 2020-04-03 MED ORDER — POLYSACCHARIDE IRON COMPLEX 150 MG PO CAPS: 150.0000 mg | ORAL_CAPSULE | Freq: Every day | ORAL | 3 refills | Status: DC

## 2020-04-03 NOTE — Addendum Note (Signed)
Addended by: Jaynie Collins A on: 04/03/2020 11:47 AM   Modules accepted: Orders

## 2020-04-06 MED ORDER — TERCONAZOLE 0.8 % VA CREA: 1.0000 | TOPICAL_CREAM | Freq: Every day | VAGINAL | 0 refills | Status: DC

## 2020-04-15 ENCOUNTER — Inpatient Hospital Stay (HOSPITAL_BASED_OUTPATIENT_CLINIC_OR_DEPARTMENT_OTHER): Payer: Medicaid Other

## 2020-04-15 ENCOUNTER — Other Ambulatory Visit: Payer: Self-pay

## 2020-04-15 ENCOUNTER — Ambulatory Visit (INDEPENDENT_AMBULATORY_CARE_PROVIDER_SITE_OTHER): Payer: Medicaid Other | Admitting: Family Medicine

## 2020-04-15 ENCOUNTER — Encounter (HOSPITAL_COMMUNITY): Payer: Self-pay | Admitting: Obstetrics and Gynecology

## 2020-04-15 ENCOUNTER — Inpatient Hospital Stay (HOSPITAL_COMMUNITY)
Admission: AD | Admit: 2020-04-15 | Discharge: 2020-04-17 | Disposition: A | Discharge status: Home / Self Care | Payer: Medicaid Other | Attending: Obstetrics and Gynecology | Admitting: Obstetrics and Gynecology

## 2020-04-15 VITALS — BP 113/77 | HR 93 | Wt 183.6 lb

## 2020-04-15 DIAGNOSIS — O360130 Maternal care for anti-D [Rh] antibodies, third trimester, not applicable or unspecified: Secondary | ICD-10-CM | POA: Diagnosis not present

## 2020-04-15 DIAGNOSIS — O4703 False labor before 37 completed weeks of gestation, third trimester: Secondary | ICD-10-CM

## 2020-04-15 DIAGNOSIS — O99213 Obesity complicating pregnancy, third trimester: Secondary | ICD-10-CM | POA: Insufficient documentation

## 2020-04-15 DIAGNOSIS — O3433 Maternal care for cervical incompetence, third trimester: Secondary | ICD-10-CM | POA: Insufficient documentation

## 2020-04-15 DIAGNOSIS — E669 Obesity, unspecified: Secondary | ICD-10-CM

## 2020-04-15 DIAGNOSIS — R109 Unspecified abdominal pain: Secondary | ICD-10-CM

## 2020-04-15 DIAGNOSIS — O4693 Antepartum hemorrhage, unspecified, third trimester: Secondary | ICD-10-CM | POA: Diagnosis not present

## 2020-04-15 DIAGNOSIS — O26893 Other specified pregnancy related conditions, third trimester: Secondary | ICD-10-CM | POA: Diagnosis not present

## 2020-04-15 DIAGNOSIS — Z3A29 29 weeks gestation of pregnancy: Secondary | ICD-10-CM | POA: Diagnosis not present

## 2020-04-15 DIAGNOSIS — Z20822 Contact with and (suspected) exposure to covid-19: Secondary | ICD-10-CM | POA: Insufficient documentation

## 2020-04-15 DIAGNOSIS — O99013 Anemia complicating pregnancy, third trimester: Secondary | ICD-10-CM | POA: Insufficient documentation

## 2020-04-15 DIAGNOSIS — O26853 Spotting complicating pregnancy, third trimester: Secondary | ICD-10-CM | POA: Insufficient documentation

## 2020-04-15 DIAGNOSIS — O26899 Other specified pregnancy related conditions, unspecified trimester: Secondary | ICD-10-CM

## 2020-04-15 DIAGNOSIS — Z6791 Unspecified blood type, Rh negative: Secondary | ICD-10-CM

## 2020-04-15 DIAGNOSIS — O4690 Antepartum hemorrhage, unspecified, unspecified trimester: Secondary | ICD-10-CM

## 2020-04-15 DIAGNOSIS — O0990 Supervision of high risk pregnancy, unspecified, unspecified trimester: Secondary | ICD-10-CM

## 2020-04-15 DIAGNOSIS — O3430 Maternal care for cervical incompetence, unspecified trimester: Secondary | ICD-10-CM

## 2020-04-15 DIAGNOSIS — N939 Abnormal uterine and vaginal bleeding, unspecified: Secondary | ICD-10-CM

## 2020-04-15 DIAGNOSIS — D509 Iron deficiency anemia, unspecified: Secondary | ICD-10-CM | POA: Diagnosis not present

## 2020-04-15 LAB — CBC WITH DIFFERENTIAL/PLATELET
Abs Immature Granulocytes: 0.07 10*3/uL (ref 0.00–0.07)
Basophils Absolute: 0 10*3/uL (ref 0.0–0.1)
Basophils Relative: 0 %
Eosinophils Absolute: 0.1 10*3/uL (ref 0.0–0.5)
Eosinophils Relative: 1 %
HCT: 30.1 % — ABNORMAL LOW (ref 36.0–46.0)
Hemoglobin: 9.9 g/dL — ABNORMAL LOW (ref 12.0–15.0)
Immature Granulocytes: 1 %
Lymphocytes Relative: 29 %
Lymphs Abs: 2.3 10*3/uL (ref 0.7–4.0)
MCH: 31.7 pg (ref 26.0–34.0)
MCHC: 32.9 g/dL (ref 30.0–36.0)
MCV: 96.5 fL (ref 80.0–100.0)
Monocytes Absolute: 0.7 10*3/uL (ref 0.1–1.0)
Monocytes Relative: 10 %
Neutro Abs: 4.5 10*3/uL (ref 1.7–7.7)
Neutrophils Relative %: 59 %
Platelets: 239 10*3/uL (ref 150–400)
RBC: 3.12 MIL/uL — ABNORMAL LOW (ref 3.87–5.11)
RDW: 14.5 % (ref 11.5–15.5)
WBC: 7.7 10*3/uL (ref 4.0–10.5)
nRBC: 0 % (ref 0.0–0.2)

## 2020-04-15 LAB — URINALYSIS, ROUTINE W REFLEX MICROSCOPIC
Bilirubin Urine: NEGATIVE
Glucose, UA: NEGATIVE mg/dL
Ketones, ur: NEGATIVE mg/dL
Nitrite: NEGATIVE
Protein, ur: NEGATIVE mg/dL
Specific Gravity, Urine: 1.008 (ref 1.005–1.030)
WBC, UA: >50 WBC/hpf — ABNORMAL HIGH (ref 0–5)
pH: 7 (ref 5.0–8.0)

## 2020-04-15 LAB — TYPE AND SCREEN
ABO/RH(D): O NEG
Antibody Screen: POSITIVE

## 2020-04-15 LAB — SARS CORONAVIRUS 2 BY RT PCR (HOSPITAL ORDER, PERFORMED IN ~~LOC~~ HOSPITAL LAB): SARS Coronavirus 2: NEGATIVE

## 2020-04-15 MED ORDER — BETAMETHASONE SOD PHOS & ACET 6 (3-3) MG/ML IJ SUSP
12.0000 mg | INTRAMUSCULAR | Status: AC
  Administered 2020-04-15 – 2020-04-16 (×2): 12 mg via INTRAMUSCULAR
  Filled 2020-04-15: qty 5

## 2020-04-15 MED ORDER — ACETAMINOPHEN 325 MG PO TABS
650.0000 mg | ORAL_TABLET | ORAL | Status: DC | PRN
  Administered 2020-04-16: 650 mg via ORAL
  Filled 2020-04-15: qty 2

## 2020-04-15 MED ORDER — PRENATAL MULTIVITAMIN CH
1.0000 | ORAL_TABLET | Freq: Every day | ORAL | Status: DC
  Administered 2020-04-16 – 2020-04-17 (×2): 1 via ORAL
  Filled 2020-04-15 (×2): qty 1

## 2020-04-15 MED ORDER — LACTATED RINGERS IV SOLN: INTRAVENOUS | Status: DC

## 2020-04-15 MED ORDER — DOCUSATE SODIUM 100 MG PO CAPS
100.0000 mg | ORAL_CAPSULE | Freq: Every day | ORAL | Status: DC
  Administered 2020-04-17: 100 mg via ORAL
  Filled 2020-04-15: qty 1

## 2020-04-15 MED ORDER — NIFEDIPINE 10 MG PO CAPS
10.0000 mg | ORAL_CAPSULE | Freq: Three times a day (TID) | ORAL | Status: DC
  Filled 2020-04-15: qty 1

## 2020-04-15 MED ORDER — ZOLPIDEM TARTRATE 5 MG PO TABS: 5.0000 mg | ORAL_TABLET | Freq: Every evening | ORAL | Status: DC | PRN

## 2020-04-15 MED ORDER — NIFEDIPINE 10 MG PO CAPS
10.0000 mg | ORAL_CAPSULE | ORAL | Status: AC
  Administered 2020-04-15 (×2): 10 mg via ORAL
  Filled 2020-04-15 (×2): qty 1

## 2020-04-15 MED ORDER — BETAMETHASONE SOD PHOS & ACET 6 (3-3) MG/ML IJ SUSP
12.0000 mg | Freq: Once | INTRAMUSCULAR | Status: DC
  Filled 2020-04-15: qty 5

## 2020-04-15 MED ORDER — CALCIUM CARBONATE ANTACID 500 MG PO CHEW: 2.0000 | CHEWABLE_TABLET | ORAL | Status: DC | PRN

## 2020-04-15 NOTE — Progress Notes (Signed)
Pt. Complains of severe cramping and states she has spotting since last night

## 2020-04-15 NOTE — MAU Note (Signed)
Presents with c/o spotting x3 days ago and cramping that began last night.  Denies recent intercourse.  Endorses +FM.  Pt also states increase vaginal discharge, unsure if LOF.

## 2020-04-15 NOTE — MAU Provider Note (Signed)
History     CSN: [id number]  Arrival date and time: 04/15/20 1222   First Provider Initiated Contact with Patient 04/15/20 1320      Chief Complaint  Patient presents with  . Vaginal Bleeding  . Cramping   HPI   Ms.Theresa Fletcher is a 24 y.o. female G3P1011 @ 31w6dhere in MAU with complaints of vaginal spotting x 3 days. The discharge is pink in color and small amount.  She was seen in the office today and sent here for further evaluation. She has a history of a 16w delivery and loss. She has a cerclage in place. Reports lower back and lower abdominal cramping that comes and goes.   OB History    Gravida  3   Para  1   Term  1   Preterm      AB  1   Living  1     SAB  1   TAB      Ectopic      Multiple      Live Births  1           Past Medical History:  Diagnosis Date  . Anemia   . Breast lump on right side at 10 o'clock position 03/12/2019  . Breast pain, right 03/12/2019    Past Surgical History:  Procedure Laterality Date  . CERVICAL CERCLAGE N/A 12/19/2019   Procedure: CERCLAGE CERVICAL;  Surgeon: EChancy Milroy MD;  Location: [facility] LD ORS;  Service: Gynecology;  Laterality: N/A;  . DILATION AND EVACUATION N/A 06/09/2019   Procedure: DILATATION AND EVACUATION;  Surgeon: PAletha Halim MD;  Location: [facility] LD ORS;  Service: Gynecology;  Laterality: N/A;    Family History  Problem Relation Age of Onset  . Diabetes Maternal Grandmother   . Hypertension Maternal Grandfather   . Healthy Mother   . Healthy Father     Social History   Tobacco Use  . Smoking status: Never Smoker  . Smokeless tobacco: Never Used  Vaping Use  . Vaping Use: Never used  Substance Use Topics  . Alcohol use: Not Currently  . Drug use: Not Currently    Allergies: No Known Allergies  Medications Prior to Admission  Medication Sig Dispense Refill Last Dose  . acetaminophen (TYLENOL) 500 MG tablet Take 1,000 mg by mouth every 6 (six) hours as needed for mild  pain or headache.   04/14/2020 at Unknown time  . iron polysaccharides (NIFEREX) 150 MG capsule Take 1 capsule (150 mg total) by mouth daily. 30 capsule 3 04/14/2020 at Unknown time  . Prenatal Vit-Fe Fumarate-FA (PRENATAL MULTIVITAMIN) TABS tablet Take 1 tablet by mouth daily at 12 noon.    04/14/2020 at Unknown time  . progesterone (PROMETRIUM) 200 MG capsule Place 1 capsule (200 mg total) vaginally at bedtime. 30 capsule 6 Past Week at Unknown time  . Blood Pressure KIT 1 Device by Does not apply route once a week. To be monitored at home weekly 1 kit 0   . metroNIDAZOLE (FLAGYL) 500 MG tablet Take 1 tablet (500 mg total) by mouth 2 (two) times daily. (Patient not taking: Reported on 04/01/2020) 14 tablet 0   . ondansetron (ZOFRAN) 4 MG tablet Take 1 tablet (4 mg total) by mouth every 6 (six) hours as needed for nausea or vomiting. (Patient not taking: Reported on 01/07/2020) 30 tablet 1   . polyethylene glycol (MIRALAX / GLYCOLAX) 17 g packet Take 17 g by mouth 2 (two) times daily  as needed. (Patient not taking: Reported on 01/07/2020) 14 each 0   . terconazole (TERAZOL 3) 0.8 % vaginal cream Place 1 applicator vaginally at bedtime. (Patient not taking: Reported on 04/15/2020) 20 g 0    Results for orders placed or performed during the hospital encounter of 04/15/20 (from the past 48 hour(s))  Urinalysis, Routine w reflex microscopic Urine, Clean Catch     Status: Abnormal   Collection Time: 04/15/20  1:06 PM  Result Value Ref Range   Color, Urine YELLOW YELLOW   APPearance CLOUDY (A) CLEAR   Specific Gravity, Urine 1.008 1.005 - 1.030   pH 7.0 5.0 - 8.0   Glucose, UA NEGATIVE NEGATIVE mg/dL   Hgb urine dipstick MODERATE (A) NEGATIVE   Bilirubin Urine NEGATIVE NEGATIVE   Ketones, ur NEGATIVE NEGATIVE mg/dL   Protein, ur NEGATIVE NEGATIVE mg/dL   Nitrite NEGATIVE NEGATIVE   Leukocytes,Ua LARGE (A) NEGATIVE   RBC / HPF 6-10 0 - 5 RBC/hpf   WBC, UA >50 (H) 0 - 5 WBC/hpf   Bacteria, UA FEW (A) NONE  SEEN   Squamous Epithelial / LPF 11-20 0 - 5   Mucus PRESENT    Amorphous Crystal PRESENT     Comment: Performed at [facility], [address], [place], [place] [address]   [place] MFM OB Transvaginal  Result Date: 04/15/2020 ----------------------------------------------------------------------  OBSTETRICS REPORT                       (Signed Final 04/15/2020 04:56 pm) ---------------------------------------------------------------------- Patient Info  ID #:       [id number]                          D.O.B.:  03/02/1996 (24 yrs)  Name:       Theresa Fletcher              Visit Date: 04/15/2020 04:45 pm ---------------------------------------------------------------------- Performed By  Attending:        Johnell Comings MD         Ref. Address:     [place]  Performed By:     Jeanene Erb BS,      [facility]                    [place]  Referred By:      [facility] ---------------------------------------------------------------------- Orders  #  Description                           Code        Ordered By  1  [place] MFM OB TRANSVAGINAL                [phone]     Theresa Fletcher ----------------------------------------------------------------------  #  Order #                     Accession #                Episode #  1  [id number]                   [id number]                 [id number] ---------------------------------------------------------------------- Indications  Vaginal bleeding in pregnancy, third trimester O46.93  Cervical insufficiency, 2nd (cerclage placed   O34.32  12/19/19)  Cervical cerclage suture present, third        O34.33  trimester( 12/19/19)(On Progesterone)  Pelvic pain affecting pregnancy in third       O26.893  trimester (Cramping)  Obesity complicating pregnancy, third          O99.213  trimester (Pregravid BMI 36)  Rh negative state in antepartum                 O36.0190  Low Risk NIPS  [redacted] weeks gestation of pregnancy                Z3A.29 ---------------------------------------------------------------------- Fetal Evaluation  Num Of Fetuses:         1  Cardiac Activity:       bpm  Presentation:           Cephalic ---------------------------------------------------------------------- OB History  Gravidity:    3         Term:   1        Prem:   0        SAB:   1  TOP:          0       Ectopic:  0        Living: 1 ---------------------------------------------------------------------- Gestational Age  LMP:           29w 6d        Date:  09/19/19                 EDD:   06/25/20  Best:          29w 6d     Det. By:  LMP  (09/19/19)          EDD:   06/25/20 ---------------------------------------------------------------------- Cervix Uterus Adnexa  Cervix  Length:            3.3  cm.  Measured transvaginally. Cerclage visualized. ---------------------------------------------------------------------- Comments  This patient presented to the MAU complaining of vaginal  spotting and cramping.  A transvaginal ultrasound performed today shows an overall  cervical length of 3.28 cm long without any signs of funneling.  The cervical cerclage stitch is visualized.  The fetus is in the vertex presentation.  There were no signs of placenta previa noted. ----------------------------------------------------------------------                   Johnell Comings, MD Electronically Signed Final Report   04/15/2020 04:56 pm ----------------------------------------------------------------------  [place] MFM OB LIMITED  Result Date: 04/15/2020 ----------------------------------------------------------------------  OBSTETRICS REPORT                       (Signed Final 04/15/2020 03:16 pm) ---------------------------------------------------------------------- Patient Info  ID #:       [id number]                          D.O.B.:  05-27-1996 (24 yrs)  Name:       Theresa Fletcher              Visit  Date: 04/15/2020 01:57 pm ---------------------------------------------------------------------- Performed By  Attending:        Johnell Comings MD         Ref. Address:     [place]  Performed By:     Jeanene Erb BS,      Location[facility]                    [place]  Referred By:      [facility] ---------------------------------------------------------------------- Orders  #  Description                           Code        Ordered By  1  [place] MFM OB LIMITED                     [phone]    [facility] ----------------------------------------------------------------------  #  Order #                     Accession #                Episode #  1  [id number]                   [id number]                 [id number] ---------------------------------------------------------------------- Indications  Vaginal bleeding in pregnancy, third trimester O46.93  Cervical insufficiency, 2nd (cerclage placed   O34.32  12/19/19)  Cervical cerclage suture present, third        O34.33  trimester( 12/19/19)(On Progesterone)  Pelvic pain affecting pregnancy in third       O26.893  trimester (Cramping)  Obesity complicating pregnancy, third          O99.213  trimester (Pregravid BMI 36)  Rh negative state in antepartum                O36.0190  Low Risk NIPS  [redacted] weeks gestation of pregnancy                Z3A.29 ---------------------------------------------------------------------- Fetal Evaluation  Num Of Fetuses:         1  Fetal Heart Rate(bpm):  150  Cardiac Activity:       Observed  Presentation:           Cephalic  Placenta:               Posterior  P. Cord Insertion:      Previously Visualized  Amniotic Fluid  AFI FV:      Within normal limits  AFI Sum(cm)     %  Tile       Largest Pocket(cm)  13.3            40          3.7  RUQ(cm)       RLQ(cm)       LUQ(cm)        LLQ(cm)  3.7           3.7            3.1            2.8 ---------------------------------------------------------------------- OB History  Gravidity:    3         Term:   1        Prem:   0        SAB:   1  TOP:          0       Ectopic:  0        Living: 1 ---------------------------------------------------------------------- Gestational Age  LMP:           29w 6d        Date:  09/19/19                 EDD:   06/25/20  Best:          29w 6d     Det. By:  LMP  (09/19/19)          EDD:   06/25/20 ---------------------------------------------------------------------- Anatomy  Stomach:               Visualized             Bladder:                Visualized ---------------------------------------------------------------------- Cervix Uterus Adnexa  Cervix  Cerclage visualized. ---------------------------------------------------------------------- Comments  The patient presented to the MAU complaining of vaginal  spotting and cramping.  She currently has a cerclage in place.  A limited ultrasound performed today shows that the fetus is  in the vertex presentation.  There was normal amniotic fluid noted.  Fetal body movements and fetal breathing movements were  noted throughout today's exam. ----------------------------------------------------------------------                   Johnell Comings, MD Electronically Signed Final Report   04/15/2020 03:16 pm ----------------------------------------------------------------------  Review of Systems  Constitutional: Negative for fever.  Gastrointestinal: Positive for abdominal pain.  Genitourinary: Positive for vaginal bleeding and vaginal discharge.  Musculoskeletal: Positive for back pain.   Physical Exam   Blood pressure 112/76, pulse (!) 125, temperature 98.4 F (36.9 C), temperature source Oral, resp. rate 18, height _0  (1.499 m), weight 83.4 kg, last menstrual period 09/19/2019, SpO2 99 %, unknown if currently breastfeeding.  Physical Exam HENT:     Head: Normocephalic.  Eyes:     Pupils:  Pupils are equal, round, and reactive to light.  Abdominal:     Palpations: Abdomen is soft.     Tenderness: There is no abdominal tenderness.  Genitourinary:    Comments: Dr. Elgie Congo to examine patient.  Musculoskeletal:        General: Normal range of motion.     Cervical back: Normal range of motion.  Neurological:     General: No focal deficit present.     Mental Status: She is alert.  Psychiatric:        Mood and Affect: Mood normal.    Fetal Tracing: Baseline: 140 bpm Variability: Moderate  Accelerations: 15x15  Decelerations: None Toco: Occasional UI   MAU Course  Procedures  None  MDM  O negative: Rhogam given in the office on 7/28 Dr. Elgie Congo down to MAU to see the patient.  BMZ given.  Procardia 10 mg X 2 doses given.  Will admit per Dr. Elgie Congo.   Assessment and Plan   A:  1. Threatened premature labor in third trimester   2. Vaginal spotting   3. Premature infant of [redacted] weeks gestation   4. Vaginal bleeding during pregnancy      P  Admit to high risk OB Continuous fetal monitoring BMZ today and repeat in 24 hours.  Magnesium if delivery is imminent.   Lezlie Lye, NP 04/15/2020 5:58 PM

## 2020-04-15 NOTE — Progress Notes (Signed)
   PRENATAL VISIT NOTE  Subjective:  Katyra Casimiro Needle is a 24 y.o. G3P1011 at [redacted]w[redacted]d being seen today for ongoing prenatal care.  She is currently monitored for the following issues for this high-risk pregnancy and has Rh negative state in antepartum period; Obesity in pregnancy; BMI 30s; Cervical insufficiency during pregnancy, antepartum; Subchorionic hematoma in first trimester; Supervision of high risk pregnancy, antepartum; Cervical cerclage suture present; Request for sterilization; and Maternal iron deficiency anemia complicating pregnancy in third trimester on their problem list.  Patient reports 3 days of vaginal spotting with abdominal pain/cramping in lower abdomen x24 hours. Tylenol did not help. Reports pain distrupted sleep and had contraction like tightening in whole abdomen last night that is intermittent.   Contractions: Not present. Vag. Bleeding: Scant.  Movement: Present. Denies leaking of fluid.   The following portions of the patient's history were reviewed and updated as appropriate: allergies, current medications, past family history, past medical history, past social history, past surgical history and problem list.   Objective:   Vitals:   04/15/20 1011  BP: 113/77  Pulse: 93  Weight: 183 lb 9.6 oz (83.3 kg)    Fetal Status: Fetal Heart Rate (bpm): 138   Movement: Present     General:  Alert, oriented and cooperative. Patient is in no acute distress.  Skin: Skin is warm and dry. No rash noted.   Cardiovascular: Normal heart rate noted  Respiratory: Normal respiratory effort, no problems with respiration noted  Abdomen: Soft, gravid, appropriate for gestational age.  Pain/Pressure: Present     Pelvic: Cervical exam performed in the presence of a chaperone closed visually and manually. Cerclage stitch in place.   Extremities: Normal range of motion.  Edema: None  Mental Status: Normal mood and affect. Normal behavior. Normal judgment and thought content.    Assessment and Plan:  Pregnancy: G3P1011 at [redacted]w[redacted]d  1. Abdominal pain affecting pregnancy - Concern for PTL  2. Vaginal bleeding during pregnancy -Pink/creamy fluid in vaginal vault - Recommend assessment for ROM - Recommend cx for GC/CT- last were negative - Recommend [place] to determine cervical length - BMZ for lung maturity   3. Cervical cerclage suture present in third trimester -Stitch is intact digitally and cervix closed visually and on bimanual  4. Cervical insufficiency during pregnancy, antepartum -cerclage in place  5. Rh negative state in antepartum period Received rhogam 7/28 as part of standard care  6. Supervision of high risk pregnancy, antepartum Send to MAU for assessment MAU provider Deatra Canter given report Discussed with Dr. Donavan Foil   Preterm labor symptoms and general obstetric precautions including but not limited to vaginal bleeding, contractions, leaking of fluid and fetal movement were reviewed in detail with the patient. Please refer to After Visit Summary for other counseling recommendations.   No follow-ups on file.  Future Appointments  Date Time Provider Department Center  04/29/2020 12:45 PM [facility] NURSE [facility]  04/29/2020 12:45 PM [facility]    Federico Flake, MD

## 2020-04-15 NOTE — Consult Note (Signed)
Neonatology Consult Note:  At the request of the patients obstetrician Dr. Elgie Congo I met with Theresa Fletcher is a 24 y.o. G3P1011 at [redacted]w[redacted]d who is admitted for contractions / cramping.  Pregnancy complicated by Rh negative state, obesity, BMI 30s; Cervical insufficiency during pregnancy, subchorionic hematoma in first trimester; Cervical cerclage suture present; Maternal iron deficiency anemia.  We discussed morbidity/mortality at this gestional age, delivery room resuscitation, including intubation and surfactant in DR.  Discussed mechanical ventilation and risk for chronic lung disease, risk for IVH with potential for motor / cognitive deficits, ROP, NEC, sepsis, as well as temperature instability and feeding immaturity.  Discussed NG / OG feeds, benefits of MBM in reducing incidence of NEC.   Discussed likely length of stay.  Thank you for allowing [place] participate in her care.  Please call with questions.  BHiginio Roger DO  Neonatologist  The total length of face-to-face or floor / unit time for this encounter was 25 minutes.  Counseling and / or coordination of care was greater than fifty percent of the time.

## 2020-04-15 NOTE — H&P (Signed)
History     CSN: [id number]  Arrival date and time: 04/15/20 1222   First Provider Initiated Contact with Patient 04/15/20 1320      Chief Complaint  Patient presents with   Vaginal Bleeding   Cramping   HPI   Theresa Fletcher is a 24 y.o. female G3P1011 @ 50w6dhere in MAU with complaints of vaginal spotting x 3 days. The discharge is pink in color and small amount.  She was seen in the office today and sent here for further evaluation. She has a history of a 16w delivery and loss. She has a cerclage in place. Reports lower back and lower abdominal cramping that comes and goes.   OB History    Gravida  3   Para  1   Term  1   Preterm      AB  1   Living  1     SAB  1   TAB      Ectopic      Multiple      Live Births  1           Past Medical History:  Diagnosis Date   Anemia    Breast lump on right side at 10 o'clock position 03/12/2019   Breast pain, right 03/12/2019    Past Surgical History:  Procedure Laterality Date   CERVICAL CERCLAGE N/A 12/19/2019   Procedure: CERCLAGE CERVICAL;  Surgeon: EChancy Milroy MD;  Location: [facility];  Service: Gynecology;  Laterality: N/A;   DILATION AND EVACUATION N/A 06/09/2019   Procedure: DILATATION AND EVACUATION;  Surgeon: PAletha Halim MD;  Location: [facility];  Service: Gynecology;  Laterality: N/A;    Family History  Problem Relation Age of Onset   Diabetes Maternal Grandmother    Hypertension Maternal Grandfather    Healthy Mother    Healthy Father     Social History   Tobacco Use   Smoking status: Never Smoker   Smokeless tobacco: Never Used  VScientific laboratory technicianUse: Never used  Substance Use Topics   Alcohol use: Not Currently   Drug use: Not Currently    Allergies: No Known Allergies  Medications Prior to Admission  Medication Sig Dispense Refill Last Dose   acetaminophen (TYLENOL) 500 MG tablet Take 1,000 mg by mouth every 6 (six) hours as needed for mild  pain or headache.   04/14/2020 at Unknown time   iron polysaccharides (NIFEREX) 150 MG capsule Take 1 capsule (150 mg total) by mouth daily. 30 capsule 3 04/14/2020 at Unknown time   Prenatal Vit-Fe Fumarate-FA (PRENATAL MULTIVITAMIN) TABS tablet Take 1 tablet by mouth daily at 12 noon.    04/14/2020 at Unknown time   progesterone (PROMETRIUM) 200 MG capsule Place 1 capsule (200 mg total) vaginally at bedtime. 30 capsule 6 Past Week at Unknown time   Blood Pressure KIT 1 Device by Does not apply route once a week. To be monitored at home weekly 1 kit 0    metroNIDAZOLE (FLAGYL) 500 MG tablet Take 1 tablet (500 mg total) by mouth 2 (two) times daily. (Patient not taking: Reported on 04/01/2020) 14 tablet 0    ondansetron (ZOFRAN) 4 MG tablet Take 1 tablet (4 mg total) by mouth every 6 (six) hours as needed for nausea or vomiting. (Patient not taking: Reported on 01/07/2020) 30 tablet 1    polyethylene glycol (MIRALAX / GLYCOLAX) 17 g packet Take 17 g by mouth 2 (two) times daily  as needed. (Patient not taking: Reported on 01/07/2020) 14 each 0    terconazole (TERAZOL 3) 0.8 % vaginal cream Place 1 applicator vaginally at bedtime. (Patient not taking: Reported on 04/15/2020) 20 g 0    Results for orders placed or performed during the hospital encounter of 04/15/20 (from the past 48 hour(s))  Urinalysis, Routine w reflex microscopic Urine, Clean Catch     Status: Abnormal   Collection Time: 04/15/20  1:06 PM  Result Value Ref Range   Color, Urine YELLOW YELLOW   APPearance CLOUDY (A) CLEAR   Specific Gravity, Urine 1.008 1.005 - 1.030   pH 7.0 5.0 - 8.0   Glucose, UA NEGATIVE NEGATIVE mg/dL   Hgb urine dipstick MODERATE (A) NEGATIVE   Bilirubin Urine NEGATIVE NEGATIVE   Ketones, ur NEGATIVE NEGATIVE mg/dL   Protein, ur NEGATIVE NEGATIVE mg/dL   Nitrite NEGATIVE NEGATIVE   Leukocytes,Ua LARGE (A) NEGATIVE   RBC / HPF 6-10 0 - 5 RBC/hpf   WBC, UA >50 (H) 0 - 5 WBC/hpf   Bacteria, UA FEW (A) NONE  SEEN   Squamous Epithelial / LPF 11-20 0 - 5   Mucus PRESENT    Amorphous Crystal PRESENT     Comment: Performed at [facility], [address], [place], [place] [address]   [place] MFM OB Transvaginal  Result Date: 04/15/2020 ----------------------------------------------------------------------  OBSTETRICS REPORT                       (Signed Final 04/15/2020 04:56 pm) ---------------------------------------------------------------------- Patient Info  ID #:       [id number]                          D.O.B.:  Feb 18, 1996 (24 yrs)  Name:       Theresa Fletcher              Visit Date: 04/15/2020 04:45 pm ---------------------------------------------------------------------- Performed By  Attending:        Johnell Comings MD         Ref. Address:     [place]  Performed By:     Jeanene Erb BS,      [facility]                    [place]  Referred By:      [facility] ---------------------------------------------------------------------- Orders  #  Description                           Code        Ordered By  1  [place] MFM OB TRANSVAGINAL                [id number]     Etha Stambaugh ----------------------------------------------------------------------  #  Order #                     Accession #                Episode #  1  [id number]                   [id number]                 [id number] ---------------------------------------------------------------------- Indications  Vaginal bleeding in pregnancy, third trimester O46.93  Cervical insufficiency, 2nd (cerclage placed   O34.32  12/19/19)  Cervical cerclage suture present, third        O34.33  trimester( 12/19/19)(On Progesterone)  Pelvic pain affecting pregnancy in third       O26.893  trimester (Cramping)  Obesity complicating pregnancy, third          O99.213  trimester (Pregravid BMI 36)  Rh negative state in antepartum                 O36.0190  Low Risk NIPS  [redacted] weeks gestation of pregnancy                Z3A.29 ---------------------------------------------------------------------- Fetal Evaluation  Num Of Fetuses:         1  Cardiac Activity:       bpm  Presentation:           Cephalic ---------------------------------------------------------------------- OB History  Gravidity:    3         Term:   1        Prem:   0        SAB:   1  TOP:          0       Ectopic:  0        Living: 1 ---------------------------------------------------------------------- Gestational Age  LMP:           29w 6d        Date:  09/19/19                 EDD:   06/25/20  Best:          29w 6d     Det. By:  LMP  (09/19/19)          EDD:   06/25/20 ---------------------------------------------------------------------- Cervix Uterus Adnexa  Cervix  Length:            3.3  cm.  Measured transvaginally. Cerclage visualized. ---------------------------------------------------------------------- Comments  This patient presented to the MAU complaining of vaginal  spotting and cramping.  A transvaginal ultrasound performed today shows an overall  cervical length of 3.28 cm long without any signs of funneling.  The cervical cerclage stitch is visualized.  The fetus is in the vertex presentation.  There were no signs of placenta previa noted. ----------------------------------------------------------------------                   Johnell Comings, MD Electronically Signed Final Report   04/15/2020 04:56 pm ----------------------------------------------------------------------  [place] MFM OB LIMITED  Result Date: 04/15/2020 ----------------------------------------------------------------------  OBSTETRICS REPORT                       (Signed Final 04/15/2020 03:16 pm) ---------------------------------------------------------------------- Patient Info  ID #:       [id number]                          D.O.B.:  05-27-1996 (24 yrs)  Name:       Theresa Fletcher              Visit  Date: 04/15/2020 01:57 pm ---------------------------------------------------------------------- Performed By  Attending:        Johnell Comings MD         Ref. Address:     [place]  Performed By:     Jeanene Erb BS,      [facility]                    [place]  Referred By:      [facility] ---------------------------------------------------------------------- Orders  #  Description                           Code        Ordered By  1  [place] MFM OB LIMITED                     [phone]    [facility] Iacovelli [facility] ----------------------------------------------------------------------  #  Order #                     Accession #                Episode #  1  [id number]                   [id number]                 [id number] ---------------------------------------------------------------------- Indications  Vaginal bleeding in pregnancy, third trimester O46.93  Cervical insufficiency, 2nd (cerclage placed   O34.32  12/19/19)  Cervical cerclage suture present, third        O34.33  trimester( 12/19/19)(On Progesterone)  Pelvic pain affecting pregnancy in third       O26.893  trimester (Cramping)  Obesity complicating pregnancy, third          O99.213  trimester (Pregravid BMI 36)  Rh negative state in antepartum                O36.0190  Low Risk NIPS  [redacted] weeks gestation of pregnancy                Z3A.29 ---------------------------------------------------------------------- Fetal Evaluation  Num Of Fetuses:         1  Fetal Heart Rate(bpm):  150  Cardiac Activity:       Observed  Presentation:           Cephalic  Placenta:               Posterior  P. Cord Insertion:      Previously Visualized  Amniotic Fluid  AFI FV:      Within normal limits  AFI Sum(cm)     %  Tile       Largest Pocket(cm)  13.3            40          3.7  RUQ(cm)       RLQ(cm)       LUQ(cm)        LLQ(cm)  3.7           3.7            3.1            2.8 ---------------------------------------------------------------------- OB History  Gravidity:    3         Term:   1        Prem:   0        SAB:   1  TOP:          0       Ectopic:  0        Living: 1 ---------------------------------------------------------------------- Gestational Age  LMP:           29w 6d        Date:  09/19/19                 EDD:   06/25/20  Best:          29w 6d     Det. By:  LMP  (09/19/19)          EDD:   06/25/20 ---------------------------------------------------------------------- Anatomy  Stomach:               Visualized             Bladder:                Visualized ---------------------------------------------------------------------- Cervix Uterus Adnexa  Cervix  Cerclage visualized. ---------------------------------------------------------------------- Comments  The patient presented to the MAU complaining of vaginal  spotting and cramping.  She currently has a cerclage in place.  A limited ultrasound performed today shows that the fetus is  in the vertex presentation.  There was normal amniotic fluid noted.  Fetal body movements and fetal breathing movements were  noted throughout today's exam. ----------------------------------------------------------------------                   Johnell Comings, MD Electronically Signed Final Report   04/15/2020 03:16 pm ----------------------------------------------------------------------  Review of Systems  Constitutional: Negative for fever.  Gastrointestinal: Positive for abdominal pain.  Genitourinary: Positive for vaginal bleeding and vaginal discharge.  Musculoskeletal: Positive for back pain.   Physical Exam   Blood pressure 112/76, pulse (!) 125, temperature 98.4 F (36.9 C), temperature source Oral, resp. rate 18, height '4\' 11"'$  (1.499 m), weight 83.4 kg, last menstrual period 09/19/2019, SpO2 99 %, unknown if currently breastfeeding.  Physical Exam HENT:     Head: Normocephalic.  Eyes:     Pupils:  Pupils are equal, round, and reactive to light.  Abdominal:     Palpations: Abdomen is soft.     Tenderness: There is no abdominal tenderness.  Genitourinary:    Comments: Dr. Elgie Congo to examine patient.  Musculoskeletal:        General: Normal range of motion.     Cervical back: Normal range of motion.  Neurological:     General: No focal deficit present.     Mental Status: She is alert.  Psychiatric:        Mood and Affect: Mood normal.    Fetal Tracing: Baseline: 140 bpm Variability: Moderate  Accelerations: 15x15  Decelerations: None Toco: Occasional UI    Assessment and Plan   A:  1. Threatened premature labor in third trimester   2. Vaginal spotting   3. Premature infant of [redacted] weeks gestation   4. Vaginal bleeding during pregnancy      P  Admit to high risk OB Continuous fetal monitoring BMZ today and repeat in 24 hours.  Magnesium if delivery is imminent.   Lezlie Lye, NP 04/15/2020 5:58 PM

## 2020-04-16 DIAGNOSIS — O4703 False labor before 37 completed weeks of gestation, third trimester: Secondary | ICD-10-CM

## 2020-04-16 DIAGNOSIS — O3430 Maternal care for cervical incompetence, unspecified trimester: Secondary | ICD-10-CM | POA: Diagnosis not present

## 2020-04-16 DIAGNOSIS — O26853 Spotting complicating pregnancy, third trimester: Secondary | ICD-10-CM

## 2020-04-16 DIAGNOSIS — Z3A30 30 weeks gestation of pregnancy: Secondary | ICD-10-CM | POA: Diagnosis not present

## 2020-04-16 LAB — CULTURE, OB URINE: Culture: <10000 — AB

## 2020-04-16 NOTE — Progress Notes (Signed)
Patient ID: Theresa Fletcher, female   DOB: May 25, 1996, 24 y.o.   MRN: [id number] FACULTY PRACTICE ANTEPARTUM(COMPREHENSIVE) NOTE  Theresa Fletcher is a 24 y.o. G3P1011 with Estimated Date of Delivery: 06/25/20   By   [redacted]w[redacted]d  who is admitted for uterine activity with a cerclage.    Fetal presentation is unsure. Length of Stay:  0  Days  Date of admission:04/15/2020  Subjective: No cramping or contractions during night or this am Patient reports the fetal movement as active. Patient reports uterine contraction  activity as none. Patient reports  vaginal bleeding as just pink spots when she wipes in restroom. Patient describes fluid per vagina as None.  Vitals:  Blood pressure 120/69, pulse 93, temperature 97.8 F (36.6 C), temperature source Oral, resp. rate 18, height 4\' 11"  (1.499 m), weight 83.4 kg, last menstrual period 09/19/2019, SpO2 100 %, unknown if currently breastfeeding. Vitals:   04/15/20 1647 04/15/20 1708 04/15/20 2007 04/16/20 0539  BP: 109/62 (!) 93/47 112/72 120/69  Pulse: 99 (!) 107 100 93  Resp:   18 18  Temp:   97.8 F (36.6 C) 97.8 F (36.6 C)  TempSrc:   Oral Oral  SpO2:   99% 100%  Weight:      Height:       Physical Examination:  General appearance - alert, well appearing, and in no distress Abdomen - soft, nontender, nondistended, no masses or organomegaly Fundal Height:  size equals dates Pelvic Exam:  examination not indicated Cervical Exam: Not evaluated. Extremities: extremities normal, atraumatic, no cyanosis or edema with DTRs 2+ bilaterally Membranes:intact  Fetal Monitoring:  Baseline: 150 bpm, Variability: Good {> 6 bpm), Accelerations: Reactive and Decelerations: Absent   reactive  Labs:  Results for orders placed or performed during the hospital encounter of 04/15/20 (from the past 24 hour(s))  Urinalysis, Routine w reflex microscopic Urine, Clean Catch   Collection Time: 04/15/20  1:06 PM  Result Value Ref Range   Color, Urine YELLOW  YELLOW   APPearance CLOUDY (A) CLEAR   Specific Gravity, Urine 1.008 1.005 - 1.030   pH 7.0 5.0 - 8.0   Glucose, UA NEGATIVE NEGATIVE mg/dL   Hgb urine dipstick MODERATE (A) NEGATIVE   Bilirubin Urine NEGATIVE NEGATIVE   Ketones, ur NEGATIVE NEGATIVE mg/dL   Protein, ur NEGATIVE NEGATIVE mg/dL   Nitrite NEGATIVE NEGATIVE   Leukocytes,Ua LARGE (A) NEGATIVE   RBC / HPF 6-10 0 - 5 RBC/hpf   WBC, UA >50 (H) 0 - 5 WBC/hpf   Bacteria, UA FEW (A) NONE SEEN   Squamous Epithelial / LPF 11-20 0 - 5   Mucus PRESENT    Amorphous Crystal PRESENT   Type and screen [facility]   Collection Time: 04/15/20  4:09 PM  Result Value Ref Range   ABO/RH(D) O NEG    Antibody Screen POS    Sample Expiration 04/18/2020,2359    Antibody Identification      PASSIVELY ACQUIRED ANTI-D Performed at [facility], [address], [place], [place]   SARS Coronavirus 2 by RT PCR (hospital order, performed in [facility] hospital lab) Nasopharyngeal Nasopharyngeal Swab   Collection Time: 04/15/20  4:43 PM   Specimen: Nasopharyngeal Swab  Result Value Ref Range   SARS Coronavirus 2 NEGATIVE NEGATIVE  CBC with Differential/Platelet   Collection Time: 04/15/20  6:44 PM  Result Value Ref Range   WBC 7.7 4.0 - 10.5 K/uL   RBC 3.12 (L) 3.87 -  5.11 MIL/uL   Hemoglobin 9.9 (L) 12.0 - 15.0 g/dL   HCT 16.1 (L) 36 - 46 %   MCV 96.5 80.0 - 100.0 fL   MCH 31.7 26.0 - 34.0 pg   MCHC 32.9 30.0 - 36.0 g/dL   RDW 09.6 04.5 - 40.9 %   Platelets 239 150 - 400 K/uL   nRBC 0.0 0.0 - 0.2 %   Neutrophils Relative % 59 %   Neutro Abs 4.5 1.7 - 7.7 K/uL   Lymphocytes Relative 29 %   Lymphs Abs 2.3 0.7 - 4.0 K/uL   Monocytes Relative 10 %   Monocytes Absolute 0.7 0 - 1 K/uL   Eosinophils Relative 1 %   Eosinophils Absolute 0.1 0 - 0 K/uL   Basophils Relative 0 %   Basophils Absolute 0.0 0 - 0 K/uL   Immature Granulocytes 1 %   Abs Immature Granulocytes 0.07 0.00 - 0.07 K/uL    Imaging  Studies:    [place] MFM OB Transvaginal  Result Date: 04/15/2020 ----------------------------------------------------------------------  OBSTETRICS REPORT                       (Signed Final 04/15/2020 04:56 pm) ---------------------------------------------------------------------- Patient Info  ID #:       [id number]                          D.O.B.:  06-07-1996 (24 yrs)  Name:       Theresa Fletcher              Visit Date: 04/15/2020 04:45 pm ---------------------------------------------------------------------- Performed By  Attending:        Ma Rings MD         Ref. Address:     [address]                                                             [address]  Performed By:     Emeline Darling BS,      [facility]  Referred By:      [facility] ---------------------------------------------------------------------- Orders  #  Description                           Code        Ordered By  1  [place] MFM OB TRANSVAGINAL                [phone]     Venia Carbon ----------------------------------------------------------------------  #  Order #                     Accession #                Episode #  1  [id number]  [id number]                 [id number] ---------------------------------------------------------------------- Indications  Vaginal bleeding in pregnancy, third trimester O46.93  Cervical insufficiency, 2nd (cerclage placed   O34.32  12/19/19)  Cervical cerclage suture present, third        O34.33  trimester( 12/19/19)(On Progesterone)  Pelvic pain affecting pregnancy in third       O26.893  trimester (Cramping)  Obesity complicating pregnancy, third          O99.213  trimester (Pregravid BMI 36)  Rh negative state in antepartum                O36.0190  Low Risk NIPS  [redacted] weeks gestation of pregnancy                Z3A.29 ---------------------------------------------------------------------- Fetal  Evaluation  Num Of Fetuses:         1  Cardiac Activity:       bpm  Presentation:           Cephalic ---------------------------------------------------------------------- OB History  Gravidity:    3         Term:   1        Prem:   0        SAB:   1  TOP:          0       Ectopic:  0        Living: 1 ---------------------------------------------------------------------- Gestational Age  LMP:           29w 6d        Date:  09/19/19                 EDD:   06/25/20  Best:          29w 6d     Det. By:  LMP  (09/19/19)          EDD:   06/25/20 ---------------------------------------------------------------------- Cervix Uterus Adnexa  Cervix  Length:            3.3  cm.  Measured transvaginally. Cerclage visualized. ---------------------------------------------------------------------- Comments  This patient presented to the MAU complaining of vaginal  spotting and cramping.  A transvaginal ultrasound performed today shows an overall  cervical length of 3.28 cm long without any signs of funneling.  The cervical cerclage stitch is visualized.  The fetus is in the vertex presentation.  There were no signs of placenta previa noted. ----------------------------------------------------------------------                   Ma RingsVictor Fang, MD Electronically Signed Final Report   04/15/2020 04:56 pm ----------------------------------------------------------------------  US MFM OB LIMITED  Result Date: 04/15/2020 ----------------------------------------------------------------------  OBSTETRICS REPORT                       (Signed Final 04/15/2020 03:16 pm) ---------------------------------------------------------------------- Patient Info  ID #:       [id number]                          D.O.B.:  [birth year] (24 yrs)  Name:       Theresa Fletcher              Visit Date: 04/15/2020 01:57 pm ---------------------------------------------------------------------- Performed By  Attending:        Ma RingsVictor Fang MD         Ref. Address:  [address]                                                             [address]  Performed By:     Emeline Darling BS,      [facility]  Referred By:      [facility] ---------------------------------------------------------------------- Orders  #  Description                           Code        Ordered By  1  [place] MFM OB LIMITED                     [phone]    JENNIFER [facility] ----------------------------------------------------------------------  #  Order #                     Accession #                Episode #  1  [id number]                   [id number]                 [id number] ---------------------------------------------------------------------- Indications  Vaginal bleeding in pregnancy, third trimester O46.93  Cervical insufficiency, 2nd (cerclage placed   O34.32  12/19/19)  Cervical cerclage suture present, third        O34.33  trimester( 12/19/19)(On Progesterone)  Pelvic pain affecting pregnancy in third       O26.893  trimester (Cramping)  Obesity complicating pregnancy, third          O99.213  trimester (Pregravid BMI 36)  Rh negative state in antepartum                O36.0190  Low Risk NIPS  [redacted] weeks gestation of pregnancy                Z3A.29 ---------------------------------------------------------------------- Fetal Evaluation  Num Of Fetuses:         1  Fetal Heart Rate(bpm):  150  Cardiac Activity:       Observed  Presentation:           Cephalic  Placenta:               Posterior  P. Cord Insertion:      Previously Visualized  Amniotic Fluid  AFI FV:      Within normal limits  AFI Sum(cm)     %Tile       Largest Pocket(cm)  13.3            40          3.7  RUQ(cm)       RLQ(cm)       LUQ(cm)        LLQ(cm)  3.7  3.7           3.1            2.8 ---------------------------------------------------------------------- OB History  Gravidity:    3         Term:   1        Prem:   0        SAB:    1  TOP:          0       Ectopic:  0        Living: 1 ---------------------------------------------------------------------- Gestational Age  LMP:           29w 6d        Date:  09/19/19                 EDD:   06/25/20  Best:          29w 6d     Det. By:  LMP  (09/19/19)          EDD:   06/25/20 ---------------------------------------------------------------------- Anatomy  Stomach:               Visualized             Bladder:                Visualized ---------------------------------------------------------------------- Cervix Uterus Adnexa  Cervix  Cerclage visualized. ---------------------------------------------------------------------- Comments  The patient presented to the MAU complaining of vaginal  spotting and cramping.  She currently has a cerclage in place.  A limited ultrasound performed today shows that the fetus is  in the vertex presentation.  There was normal amniotic fluid noted.  Fetal body movements and fetal breathing movements were  noted throughout today's exam. ----------------------------------------------------------------------                   Ma Rings, MD Electronically Signed Final Report   04/15/2020 03:16 pm ----------------------------------------------------------------------    Medications:  Scheduled . betamethasone acetate-betamethasone sodium phosphate  12 mg Intramuscular Q24 Hr x 2  . docusate sodium  100 mg Oral Daily  . prenatal multivitamin  1 tablet Oral Q1200   I have reviewed the patient's current medications.  ASSESSMENT: G3P1011 [redacted]w[redacted]d Estimated Date of Delivery: 06/25/20  Patient Active Problem List   Diagnosis Date Noted  . Maternal iron deficiency anemia complicating pregnancy in third trimester 04/03/2020  . Request for sterilization 04/01/2020  . Cervical cerclage suture present 02/13/2020  . Supervision of high risk pregnancy, antepartum 11/28/2019  . Subchorionic hematoma in first trimester 11/02/2019  . Cervical insufficiency during  pregnancy, antepartum 06/09/2019  . Rh negative state in antepartum period 05/06/2019  . Obesity in pregnancy 05/06/2019  . BMI 30s 05/06/2019    PLAN:  It appears she had some uptick in uterine activity which can cause some lower segment pressure and with a cerclge, spotting, she has not had any bleeding just pinkish spotting when wiping, non on a pad  >2nd dose of betamethasone tonight >continue in house observation >continue procardia as ordered  Anticipate discharge tomorrow if stable clinical course  Lazaro Arms 04/16/2020,7:50 AM

## 2020-04-17 DIAGNOSIS — O3433 Maternal care for cervical incompetence, third trimester: Secondary | ICD-10-CM

## 2020-04-17 DIAGNOSIS — Z3A29 29 weeks gestation of pregnancy: Secondary | ICD-10-CM | POA: Diagnosis not present

## 2020-04-17 NOTE — Progress Notes (Signed)
Pt ambulated at discharge with her significant other. Pt in stable condition. Pt discharged with all belongings.

## 2020-04-17 NOTE — Discharge Summary (Signed)
Physician Discharge Summary  Patient ID: Theresa Fletcher MRN: [id number] DOB/AGE: 24-13-1997 24 y.o.  Admit date: 04/15/2020 Discharge date: 04/17/2020  Admission Diagnoses: spotting in third trimester, cerclage present  Discharge Diagnoses:  Active Problems:   Cervical insufficiency during pregnancy, antepartum   Cervical cerclage suture present   Discharged Condition: good  Hospital Course: G3P1011 admitted at 29.6 weeks with vaginal spotting x 3 days.  This was concerning due to hx of 16 week loss and current cerclage placement. Pt was examined and no cervical change was noted. Pt was admitted for tocolysis with oral procardia and betamethasone therapy.  TVUS showed adequate cervical length.  Uterine activity never increased beyond irritability.  Pt has been comfortable and is in good spirits.  Consults: Neonatology  Significant Diagnostic Studies: labs: CBC and radiology: Ultrasound: transvaginal u/s  Treatments: IV hydration and steroids: betamethasone  Discharge Exam: Blood pressure (!) 92/47, pulse (!) 101, temperature 98.6 F (37 C), temperature source Oral, resp. rate 18, height '4\' 11"'  (1.499 m), weight 83.4 kg, last menstrual period 09/19/2019, SpO2 99 %, unknown if currently breastfeeding. General appearance: alert, cooperative and no distress Head: Normocephalic, without obvious abnormality, atraumatic Resp: clear to auscultation bilaterally Cardio: regular rate and rhythm GI: soft, non-tender; bowel sounds normal; no masses,  no organomegaly Extremities: extremities normal, atraumatic, no cyanosis or edema and Homans sign is negative, no sign of DVT  Disposition: Discharge disposition: 01-Home or Self Care       Discharge Instructions    Discharge activity:   Complete by: As directed    Pelvic rest   Discharge diet:  No restrictions   Complete by: As directed    Discharge instructions   Complete by: As directed    Continue pelvic rest, monitor for signs  and symptoms of preterm labor   Do not have sex or do anything that might make you have an orgasm   Complete by: As directed    Notify physician for a general feeling that "something is not right"   Complete by: As directed    Notify physician for increase or change in vaginal discharge   Complete by: As directed    Notify physician for intestinal cramps, with or without diarrhea, sometimes described as "gas pain"   Complete by: As directed    Notify physician for leaking of fluid   Complete by: As directed    Notify physician for low, dull backache, unrelieved by heat or Tylenol   Complete by: As directed    Notify physician for menstrual like cramps   Complete by: As directed    Notify physician for pelvic pressure   Complete by: As directed    Notify physician for uterine contractions.  These may be painless and feel like the uterus is tightening or the baby is  "balling up"   Complete by: As directed    Notify physician for vaginal bleeding   Complete by: As directed    PRETERM LABOR:  Includes any of the follwing symptoms that occur between 20 - [redacted] weeks gestation.  If these symptoms are not stopped, preterm labor can result in preterm delivery, placing your baby at risk   Complete by: As directed      Allergies as of 04/17/2020   No Known Allergies     Medication List    STOP taking these medications   metroNIDAZOLE 500 MG tablet Commonly known as: FLAGYL   ondansetron 4 MG tablet Commonly known as: ZOFRAN   polyethylene glycol  17 g packet Commonly known as: MIRALAX / GLYCOLAX   terconazole 0.8 % vaginal cream Commonly known as: TERAZOL 3     TAKE these medications   acetaminophen 500 MG tablet Commonly known as: TYLENOL Take 1,000 mg by mouth every 6 (six) hours as needed for mild pain or headache.   Blood Pressure Kit 1 Device by Does not apply route once a week. To be monitored at home weekly   iron polysaccharides 150 MG capsule Commonly known as:  NIFEREX Take 1 capsule (150 mg total) by mouth daily.   prenatal multivitamin Tabs tablet Take 1 tablet by mouth daily at 12 noon.   progesterone 200 MG capsule Commonly known as: Prometrium Place 1 capsule (200 mg total) vaginally at bedtime.       Follow-up [place] for [employer] at [facility]. Schedule an appointment as soon as possible for a visit on 04/22/2020.   Specialty: Obstetrics and Gynecology Why: f/u from hospital admit Contact information: [address] [place] [phone]              Signed: Griffin Basil 04/17/2020, 9:48 AM

## 2020-04-22 ENCOUNTER — Encounter: Payer: Self-pay | Admitting: *Deleted

## 2020-04-22 ENCOUNTER — Other Ambulatory Visit: Payer: Self-pay

## 2020-04-22 ENCOUNTER — Encounter: Payer: Self-pay | Admitting: Radiology

## 2020-04-22 ENCOUNTER — Ambulatory Visit (INDEPENDENT_AMBULATORY_CARE_PROVIDER_SITE_OTHER): Payer: Medicaid Other | Admitting: Advanced Practice Midwife

## 2020-04-22 VITALS — BP 109/75 | HR 102 | Wt 183.0 lb

## 2020-04-22 DIAGNOSIS — B9689 Other specified bacterial agents as the cause of diseases classified elsewhere: Secondary | ICD-10-CM

## 2020-04-22 DIAGNOSIS — Z3A30 30 weeks gestation of pregnancy: Secondary | ICD-10-CM

## 2020-04-22 DIAGNOSIS — N939 Abnormal uterine and vaginal bleeding, unspecified: Secondary | ICD-10-CM

## 2020-04-22 DIAGNOSIS — O3430 Maternal care for cervical incompetence, unspecified trimester: Secondary | ICD-10-CM

## 2020-04-22 DIAGNOSIS — O0990 Supervision of high risk pregnancy, unspecified, unspecified trimester: Secondary | ICD-10-CM

## 2020-04-22 DIAGNOSIS — N760 Acute vaginitis: Secondary | ICD-10-CM

## 2020-04-22 MED ORDER — METRONIDAZOLE 500 MG PO TABS: 500.0000 mg | ORAL_TABLET | Freq: Two times a day (BID) | ORAL | 0 refills | Status: DC

## 2020-04-22 NOTE — Progress Notes (Signed)
   PRENATAL VISIT NOTE  Subjective:  Theresa Fletcher is a 24 y.o. G3P1011 at [redacted]w[redacted]d being seen today for ongoing prenatal care.  She is currently monitored for the following issues for this high-risk pregnancy and has Rh negative state in antepartum period; Obesity in pregnancy; BMI 30s; Cervical insufficiency during pregnancy, antepartum; Subchorionic hematoma in first trimester; Supervision of high risk pregnancy, antepartum; Cervical cerclage suture present; Request for sterilization; and Maternal iron deficiency anemia complicating pregnancy in third trimester on their problem list.  Patient reports no complaints.  Contractions: Not present. Vag. Bleeding: Scant.  Movement: Present. Denies leaking of fluid.   The following portions of the patient's history were reviewed and updated as appropriate: allergies, current medications, past family history, past medical history, past social history, past surgical history and problem list. Problem list updated.  Objective:   Vitals:   04/22/20 0937  BP: 109/75  Pulse: (!) 102  Weight: 183 lb (83 kg)    Fetal Status: Fetal Heart Rate (bpm): 166 Fundal Height: 30 cm Movement: Present     General:  Alert, oriented and cooperative. Patient is in no acute distress.  Skin: Skin is warm and dry. No rash noted.   Cardiovascular: Normal heart rate noted  Respiratory: Normal respiratory effort, no problems with respiration noted  Abdomen: Soft, gravid, appropriate for gestational age.  Pain/Pressure: Absent     Pelvic: Cervical exam performed Dilation: Closed      Extremities: Normal range of motion.  Edema: None  Mental Status: Normal mood and affect. Normal behavior. Normal judgment and thought content.   Assessment and Plan:  Pregnancy: G3P1011 at [redacted]w[redacted]d  1. Supervision of high risk pregnancy, antepartum -  No complications since [facility] discharge 04/17/2020  2. Vaginal spotting - No bleeding noted on speculum exam. Cervix visually  closed  3. Bacterial vaginosis - Thin white discharge throughout vault c/w BV. Will treat - metroNIDAZOLE (FLAGYL) 500 MG tablet; Take 1 tablet (500 mg total) by mouth 2 (two) times daily.  Dispense: 14 tablet; Refill: 0  4. Cervical cerclage suture present, antepartum   Preterm labor symptoms and general obstetric precautions including but not limited to vaginal bleeding, contractions, leaking of fluid and fetal movement were reviewed in detail with the patient. Please refer to After Visit Summary for other counseling recommendations.  Return in about 2 weeks (around 05/06/2020).  Future Appointments  Date Time Provider Department Center  04/29/2020 12:45 PM [facility] NURSE [facility]  04/29/2020 12:45 PM [facility] US[facility]  05/05/2020  8:30 AM Constant, Gigi Gin, MD [facility]    Calvert Cantor, CNM

## 2020-04-22 NOTE — Patient Instructions (Signed)
Fetal Movement Counts Patient Name: ________________________________________________ Patient Due Date: ____________________ What is a fetal movement count?  A fetal movement count is the number of times that you feel your baby move during a certain amount of time. This may also be called a fetal kick count. A fetal movement count is recommended for every pregnant woman. You may be asked to start counting fetal movements as early as week 28 of your pregnancy. Pay attention to when your baby is most active. You may notice your baby's sleep and wake cycles. You may also notice things that make your baby move more. You should do a fetal movement count:  When your baby is normally most active.  At the same time each day. A good time to count movements is while you are resting, after having something to eat and drink. How do I count fetal movements? 1. Find a quiet, comfortable area. Sit, or lie down on your side. 2. Write down the date, the start time and stop time, and the number of movements that you felt between those two times. Take this information with you to your health care visits. 3. Write down your start time when you feel the first movement. 4. Count kicks, flutters, swishes, rolls, and jabs. You should feel at least 10 movements. 5. You may stop counting after you have felt 10 movements, or if you have been counting for 2 hours. Write down the stop time. 6. If you do not feel 10 movements in 2 hours, contact your health care provider for further instructions. Your health care provider may want to do additional tests to assess your baby's well-being. Contact a health care provider if:  You feel fewer than 10 movements in 2 hours.  Your baby is not moving like he or she usually does. Date: ____________ Start time: ____________ Stop time: ____________ Movements: ____________ Date: ____________ Start time: ____________ Stop time: ____________ Movements: ____________ Date: ____________  Start time: ____________ Stop time: ____________ Movements: ____________ Date: ____________ Start time: ____________ Stop time: ____________ Movements: ____________ Date: ____________ Start time: ____________ Stop time: ____________ Movements: ____________ Date: ____________ Start time: ____________ Stop time: ____________ Movements: ____________ Date: ____________ Start time: ____________ Stop time: ____________ Movements: ____________ Date: ____________ Start time: ____________ Stop time: ____________ Movements: ____________ Date: ____________ Start time: ____________ Stop time: ____________ Movements: ____________ This information is not intended to replace advice given to you by your health care provider. Make sure you discuss any questions you have with your health care provider. Document Revised: 04/11/2019 Document Reviewed: 04/11/2019 Elsevier Patient Education  2020 [employer]  

## 2020-04-27 ENCOUNTER — Inpatient Hospital Stay (HOSPITAL_COMMUNITY)
Admission: AD | Admit: 2020-04-27 | Discharge: 2020-04-27 | Disposition: A | Discharge status: Home / Self Care | Payer: Medicaid Other | Attending: Obstetrics and Gynecology | Admitting: Obstetrics and Gynecology

## 2020-04-27 ENCOUNTER — Encounter (HOSPITAL_COMMUNITY): Payer: Self-pay | Admitting: Obstetrics and Gynecology

## 2020-04-27 DIAGNOSIS — O360930 Maternal care for other rhesus isoimmunization, third trimester, not applicable or unspecified: Secondary | ICD-10-CM

## 2020-04-27 DIAGNOSIS — R102 Pelvic and perineal pain: Secondary | ICD-10-CM

## 2020-04-27 DIAGNOSIS — Z6791 Unspecified blood type, Rh negative: Secondary | ICD-10-CM

## 2020-04-27 DIAGNOSIS — Z3689 Encounter for other specified antenatal screening: Secondary | ICD-10-CM

## 2020-04-27 DIAGNOSIS — O3433 Maternal care for cervical incompetence, third trimester: Secondary | ICD-10-CM

## 2020-04-27 DIAGNOSIS — Z3A31 31 weeks gestation of pregnancy: Secondary | ICD-10-CM

## 2020-04-27 DIAGNOSIS — O26893 Other specified pregnancy related conditions, third trimester: Secondary | ICD-10-CM | POA: Insufficient documentation

## 2020-04-27 DIAGNOSIS — O26899 Other specified pregnancy related conditions, unspecified trimester: Secondary | ICD-10-CM

## 2020-04-27 LAB — WET PREP, GENITAL
Sperm: NONE SEEN
Trich, Wet Prep: NONE SEEN
Yeast Wet Prep HPF POC: NONE SEEN

## 2020-04-27 LAB — URINALYSIS, ROUTINE W REFLEX MICROSCOPIC
Bilirubin Urine: NEGATIVE
Glucose, UA: 150 mg/dL — AB
Ketones, ur: 5 mg/dL — AB
Nitrite: NEGATIVE
Protein, ur: 30 mg/dL — AB
Specific Gravity, Urine: 1.019 (ref 1.005–1.030)
WBC, UA: >50 WBC/hpf — ABNORMAL HIGH (ref 0–5)
pH: 5 (ref 5.0–8.0)

## 2020-04-27 MED ORDER — CYCLOBENZAPRINE HCL 10 MG PO TABS
10.0000 mg | ORAL_TABLET | Freq: Three times a day (TID) | ORAL | 0 refills | Status: DC | PRN
Start: 2020-04-27 — End: 2020-06-04

## 2020-04-27 MED ORDER — LACTATED RINGERS IV BOLUS: 1000.0000 mL | Freq: Once | INTRAVENOUS | Status: DC

## 2020-04-27 MED ORDER — ACETAMINOPHEN 500 MG PO TABS
1000.0000 mg | ORAL_TABLET | Freq: Once | ORAL | Status: AC
  Administered 2020-04-27: 1000 mg via ORAL
  Filled 2020-04-27: qty 2

## 2020-04-27 MED ORDER — CYCLOBENZAPRINE HCL 5 MG PO TABS
10.0000 mg | ORAL_TABLET | Freq: Once | ORAL | Status: AC
  Administered 2020-04-27: 10 mg via ORAL
  Filled 2020-04-27: qty 2

## 2020-04-27 MED ORDER — CEFADROXIL 500 MG PO CAPS: 500.0000 mg | ORAL_CAPSULE | Freq: Two times a day (BID) | ORAL | 0 refills | Status: AC

## 2020-04-27 NOTE — MAU Note (Signed)
Pt stated she thinks her mucus pug came out last night. Large thick snotty stuff come out. SHe started having cramping afterwards and has gotten stronger all day.  Denies any vag bleeding or leaking at this time. Good fetal movement reported.

## 2020-04-27 NOTE — MAU Provider Note (Addendum)
History     CSN: [id number]  Arrival date and time: 04/27/20 5364   First Provider Initiated Contact with Patient 04/27/20 1927      Chief Complaint  Patient presents with  . Contractions   Ms. Theresa Fletcher is a 24 y.o. G3P1011 at 52w4dwho presents to MAU for PTL evaluation after cramping began yesterday afternoon after she lost her mucus plug. Patient reports cramping progressively got worse, but she was able to take a 2 hour nap until she was woken up again by the cramping. Patient reports she experiences cramping in her pelvis, followed by a tightening of her lower abdomen and then cramping sensation in her low back. Patient denies these symptoms previously in pregnancy. Pt denies bleeding with this episode of cramping and denies recent intercourse.  Pt denies VB. Pt denies chest pain and SOB.  Pt denies constipation, diarrhea, or urinary problems. Pt denies fever, chills, fatigue, sweating or changes in appetite. Pt denies dizziness, light-headedness, weakness.  Pt denies LOF and reports good FM.  Current pregnancy problems? RH negative, cerclage, s/p BMZ, hx of SAB at 16 weeks Blood Type? O NEGATIVE Allergies? NKDA Current medications? Vaginal progesterone, PNV, Flagyl (currently taking) Current P[facility] & next appt? [place], 05/05/2020   OB History    Gravida  3   Para  1   Term  1   Preterm      AB  1   Living  1     SAB  1   TAB      Ectopic      Multiple      Live Births  1           Past Medical History:  Diagnosis Date  . Anemia   . Breast lump on right side at 10 o'clock position 03/12/2019  . Breast pain, right 03/12/2019    Past Surgical History:  Procedure Laterality Date  . CERVICAL CERCLAGE N/A 12/19/2019   Procedure: CERCLAGE CERVICAL;  Surgeon: EChancy Milroy MD;  Location: [facility];  Service: Gynecology;  Laterality: N/A;  . DILATION AND EVACUATION N/A 06/09/2019   Procedure: DILATATION AND EVACUATION;  Surgeon: PAletha Halim  MD;  Location: [facility];  Service: Gynecology;  Laterality: N/A;    Family History  Problem Relation Age of Onset  . Diabetes Maternal Grandmother   . Hypertension Maternal Grandfather   . Healthy Mother   . Healthy Father     Social History   Tobacco Use  . Smoking status: Never Smoker  . Smokeless tobacco: Never Used  Vaping Use  . Vaping Use: Never used  Substance Use Topics  . Alcohol use: Not Currently  . Drug use: Not Currently    Allergies: No Known Allergies  Medications Prior to Admission  Medication Sig Dispense Refill Last Dose  . iron polysaccharides (NIFEREX) 150 MG capsule Take 1 capsule (150 mg total) by mouth daily. 30 capsule 3 04/27/2020 at Unknown time  . metroNIDAZOLE (FLAGYL) 500 MG tablet Take 1 tablet (500 mg total) by mouth 2 (two) times daily. 14 tablet 0 04/27/2020 at Unknown time  . Prenatal Vit-Fe Fumarate-FA (PRENATAL MULTIVITAMIN) TABS tablet Take 1 tablet by mouth daily at 12 noon.    04/27/2020 at Unknown time  . progesterone (PROMETRIUM) 200 MG capsule Place 1 capsule (200 mg total) vaginally at bedtime. 30 capsule 6 04/27/2020 at Unknown time  . acetaminophen (TYLENOL) 500 MG tablet Take 1,000 mg by mouth every 6 (six) hours as  needed for mild pain or headache.     . Blood Pressure KIT 1 Device by Does not apply route once a week. To be monitored at home weekly 1 kit 0     Review of Systems  Constitutional: Negative for chills, diaphoresis, fatigue and fever.  Eyes: Negative for visual disturbance.  Respiratory: Negative for shortness of breath.   Cardiovascular: Negative for chest pain.  Gastrointestinal: Negative for abdominal pain, constipation, diarrhea, nausea and vomiting.  Genitourinary: Positive for pelvic pain (cramping) and vaginal discharge (mucus-like). Negative for dysuria, flank pain, frequency, urgency and vaginal bleeding.  Musculoskeletal: Positive for back pain.  Neurological: Negative for dizziness, weakness,  light-headedness and headaches.   Physical Exam   Blood pressure 116/70, pulse 92, temperature 97.7 F (36.5 C), temperature source Oral, resp. rate 20, last menstrual period 09/19/2019, SpO2 100 %, unknown if currently breastfeeding.  Patient Vitals for the past 24 hrs:  BP Temp Temp src Pulse Resp SpO2  04/27/20 2118 116/70 97.7 F (36.5 C) Oral 92 20 100 %  04/27/20 1926 121/67 -- -- (!) 103 -- --  04/27/20 1837 125/74 -- -- (!) 123 -- --    Physical Exam Vitals and nursing note reviewed. Exam conducted with a chaperone present.  Constitutional:      General: She is not in acute distress.    Appearance: Normal appearance. She is not ill-appearing, toxic-appearing or diaphoretic.  HENT:     Head: Normocephalic and atraumatic.  Pulmonary:     Effort: Pulmonary effort is normal.  Abdominal:     Palpations: Abdomen is soft.  Genitourinary:    General: Normal vulva.     Labia:        Right: No rash, tenderness or lesion.        Left: No rash, tenderness or lesion.      Vagina: Vaginal discharge (minimal amount of mucus-like discharge) present. No bleeding.     Cervix: No discharge, friability, erythema or cervical bleeding.     Comments: Cervix visually closed, cerclage knot noted at 12 o'clock position. Neurological:     Mental Status: She is alert and oriented to person, place, and time.  Psychiatric:        Mood and Affect: Mood normal.        Behavior: Behavior normal.        Thought Content: Thought content normal.        Judgment: Judgment normal.    Results for orders placed or performed during the hospital encounter of 04/27/20 (from the past 24 hour(s))  Urinalysis, Routine w reflex microscopic Urine, Clean Catch     Status: Abnormal   Collection Time: 04/27/20  6:44 PM  Result Value Ref Range   Color, Urine YELLOW YELLOW   APPearance CLOUDY (A) CLEAR   Specific Gravity, Urine 1.019 1.005 - 1.030   pH 5.0 5.0 - 8.0   Glucose, UA 150 (A) NEGATIVE mg/dL   Hgb  urine dipstick MODERATE (A) NEGATIVE   Bilirubin Urine NEGATIVE NEGATIVE   Ketones, ur 5 (A) NEGATIVE mg/dL   Protein, ur 30 (A) NEGATIVE mg/dL   Nitrite NEGATIVE NEGATIVE   Leukocytes,Ua LARGE (A) NEGATIVE   RBC / HPF 6-10 0 - 5 RBC/hpf   WBC, UA >50 (H) 0 - 5 WBC/hpf   Bacteria, UA MANY (A) NONE SEEN   Squamous Epithelial / LPF 21-50 0 - 5   Mucus PRESENT   Wet prep, genital     Status: Abnormal   Collection  Time: 04/27/20  7:57 PM  Result Value Ref Range   Yeast Wet Prep HPF POC NONE SEEN NONE SEEN   Trich, Wet Prep NONE SEEN NONE SEEN   Clue Cells Wet Prep HPF POC PRESENT (A) NONE SEEN   WBC, Wet Prep HPF POC TOO NUMEROUS TO COUNT (A) NONE SEEN   Sperm NONE SEEN    [place] MFM OB Transvaginal  Result Date: 04/15/2020 ----------------------------------------------------------------------  OBSTETRICS REPORT                       (Signed Final 04/15/2020 04:56 pm) ---------------------------------------------------------------------- Patient Info  ID #:       [id number]                          D.O.B.:  [birth year] (24 yrs)  Name:       Theresa Fletcher              Visit Date: 04/15/2020 04:45 pm ---------------------------------------------------------------------- Performed By  Attending:        Johnell Comings MD         Ref. Address:     [place]  Performed By:     Jeanene Erb BS,      Location[facility]                    [place]  Referred By:      [facility] ---------------------------------------------------------------------- Orders  #  Description                           Code        Ordered By  1  [place] MFM OB TRANSVAGINAL                [phone]     [facility] ----------------------------------------------------------------------  #  Order #                     Accession #                Episode #  1  [id number]                   [id number]                  [id number] ---------------------------------------------------------------------- Indications  Vaginal bleeding in pregnancy, third trimester O46.93  Cervical insufficiency, 2nd (cerclage placed   O34.32  12/19/19)  Cervical cerclage suture present, third        O34.33  trimester( 12/19/19)(On Progesterone)  Pelvic pain affecting pregnancy in third       O26.893  trimester (Cramping)  Obesity complicating pregnancy, third          O99.213  trimester (Pregravid BMI 36)  Rh negative  state in antepartum                O36.0190  Low Risk NIPS  [redacted] weeks gestation of pregnancy                Z3A.29 ---------------------------------------------------------------------- Fetal Evaluation  Num Of Fetuses:         1  Cardiac Activity:       bpm  Presentation:           Cephalic ---------------------------------------------------------------------- OB History  Gravidity:    3         Term:   1        Prem:   0        SAB:   1  TOP:          0       Ectopic:  0        Living: 1 ---------------------------------------------------------------------- Gestational Age  LMP:           29w 6d        Date:  09/19/19                 EDD:   06/25/20  Best:          29w 6d     Det. By:  LMP  (09/19/19)          EDD:   06/25/20 ---------------------------------------------------------------------- Cervix Uterus Adnexa  Cervix  Length:            3.3  cm.  Measured transvaginally. Cerclage visualized. ---------------------------------------------------------------------- Comments  This patient presented to the MAU complaining of vaginal  spotting and cramping.  A transvaginal ultrasound performed today shows an overall  cervical length of 3.28 cm long without any signs of funneling.  The cervical cerclage stitch is visualized.  The fetus is in the vertex presentation.  There were no signs of placenta previa noted. ----------------------------------------------------------------------                   Johnell Comings, MD Electronically Signed  Final Report   04/15/2020 04:56 pm ----------------------------------------------------------------------  [place] MFM OB LIMITED  Result Date: 04/15/2020 ----------------------------------------------------------------------  OBSTETRICS REPORT                       (Signed Final 04/15/2020 03:16 pm) ---------------------------------------------------------------------- Patient Info  ID #:       [id number]                          D.O.B.:  [birth year] (24 yrs)  Name:       Theresa Fletcher              Visit Date: 04/15/2020 01:57 pm ---------------------------------------------------------------------- Performed By  Attending:        Johnell Comings MD         Ref. Address:     [place]  Performed By:     Jeanene Erb BS,      Location:         [facility]  [place]  Referred By:      [place] ---------------------------------------------------------------------- Orders  #  Description                           Code        Ordered By  1  [place] MFM OB LIMITED                     [phone]    [facility] ----------------------------------------------------------------------  #  Order #                     Accession #                Episode #  1  [id number]                   [id number]                 [id number] ---------------------------------------------------------------------- Indications  Vaginal bleeding in pregnancy, third trimester O46.93  Cervical insufficiency, 2nd (cerclage placed   O34.32  12/19/19)  Cervical cerclage suture present, third        O34.33  trimester( 12/19/19)(On Progesterone)  Pelvic pain affecting pregnancy in third       O26.893  trimester (Cramping)  Obesity complicating pregnancy, third          O99.213  trimester (Pregravid BMI 36)  Rh negative state in antepartum                O36.0190  Low Risk NIPS  [redacted] weeks gestation of pregnancy                 Z3A.29 ---------------------------------------------------------------------- Fetal Evaluation  Num Of Fetuses:         1  Fetal Heart Rate(bpm):  150  Cardiac Activity:       Observed  Presentation:           Cephalic  Placenta:               Posterior  P. Cord Insertion:      Previously Visualized  Amniotic Fluid  AFI FV:      Within normal limits  AFI Sum(cm)     %Tile       Largest Pocket(cm)  13.3            40          3.7  RUQ(cm)       RLQ(cm)       LUQ(cm)        LLQ(cm)  3.7           3.7           3.1            2.8 ---------------------------------------------------------------------- OB History  Gravidity:    3         Term:   1        Prem:   0        SAB:   1  TOP:          0       Ectopic:  0        Living: 1 ---------------------------------------------------------------------- Gestational Age  LMP:  29w 6d        Date:  09/19/19                 EDD:   06/25/20  Best:          29w 6d     Det. By:  LMP  (09/19/19)          EDD:   06/25/20 ---------------------------------------------------------------------- Anatomy  Stomach:               Visualized             Bladder:                Visualized ---------------------------------------------------------------------- Cervix Uterus Adnexa  Cervix  Cerclage visualized. ---------------------------------------------------------------------- Comments  The patient presented to the MAU complaining of vaginal  spotting and cramping.  She currently has a cerclage in place.  A limited ultrasound performed today shows that the fetus is  in the vertex presentation.  There was normal amniotic fluid noted.  Fetal body movements and fetal breathing movements were  noted throughout today's exam. ----------------------------------------------------------------------                   Johnell Comings, MD Electronically Signed Final Report   04/15/2020 03:16 pm ----------------------------------------------------------------------   MAU Course   Procedures  MDM -r/o PTL with cerclage -BMZ x2 -UA: cloudy/150GLU/mod hgb/5ketones/30PRO/lg leuks/many bacteria, sending urine for culture -CE: visually closed, cerclage knot noted at 12 o'clock position, digital cervical exam not performed d/t cerclage in place -WetPrep: +ClueCells (pt currently on metronidazole) -GC/CT collected -EFM: reactive with baseline change       -baseline: 145       -variability: moderate       -accels: present, 15x15       -decels: absent       -TOCO: irritability, single ctx -Flexeril 86m and Tylenol 10035mgiven for pain + 1L fluid bolus, pt reports cramping now resolved -consulted with Dr. BaElgie Congorecommends empiric treatment with ABX and pt OK to be discharged home -pt discharged to home in stable condition  Orders Placed This Encounter  Procedures  . Wet prep, genital    Standing Status:   Standing    Number of Occurrences:   1  . Culture, OB Urine    Standing Status:   Standing    Number of Occurrences:   1  . Urinalysis, Routine w reflex microscopic Urine, Clean Catch    Standing Status:   Standing    Number of Occurrences:   1  . Insert peripheral IV    Standing Status:   Standing    Number of Occurrences:   1  . Discharge patient    Order Specific Question:   Discharge disposition    Answer:   01-Home or Self Care [1]    Order Specific Question:   Discharge patient date    Answer:   04/27/2020   Meds ordered this encounter  Medications  . lactated ringers bolus 1,000 mL  . cyclobenzaprine (FLEXERIL) tablet 10 mg  . acetaminophen (TYLENOL) tablet 1,000 mg  . cefadroxil (DURICEF) 500 MG capsule    Sig: Take 1 capsule (500 mg total) by mouth 2 (two) times daily for 7 days.    Dispense:  14 capsule    Refill:  0    Order Specific Question:   Supervising Provider    Answer:   DASloan Leiter1[[id number]]. cyclobenzaprine (FLEXERIL) 10 MG tablet    Sig: Take  1 tablet (10 mg total) by mouth 3 (three) times daily as needed.    Dispense:   30 tablet    Refill:  0    Order Specific Question:   Supervising Provider    Answer:   Sloan Leiter [[id number]]    Assessment and Plan   1. Pelvic cramping   2. Cervical cerclage suture present in third trimester   3. NST (non-stress test) reactive   4. [redacted] weeks gestation of pregnancy   5. Rh negative state in antepartum period     Allergies as of 04/27/2020   No Known Allergies     Medication List    TAKE these medications   acetaminophen 500 MG tablet Commonly known as: TYLENOL Take 1,000 mg by mouth every 6 (six) hours as needed for mild pain or headache.   Blood Pressure Kit 1 Device by Does not apply route once a week. To be monitored at home weekly   cefadroxil 500 MG capsule Commonly known as: DURICEF Take 1 capsule (500 mg total) by mouth 2 (two) times daily for 7 days.   cyclobenzaprine 10 MG tablet Commonly known as: FLEXERIL Take 1 tablet (10 mg total) by mouth 3 (three) times daily as needed.   iron polysaccharides 150 MG capsule Commonly known as: NIFEREX Take 1 capsule (150 mg total) by mouth daily.   metroNIDAZOLE 500 MG tablet Commonly known as: Flagyl Take 1 tablet (500 mg total) by mouth 2 (two) times daily.   prenatal multivitamin Tabs tablet Take 1 tablet by mouth daily at 12 noon.   progesterone 200 MG capsule Commonly known as: Prometrium Place 1 capsule (200 mg total) vaginally at bedtime.      -will call with culture results, if positive -RX for UTI (cefadroxil) per Dr. Elgie Congo -discussed s/sx of PTL -return MAU precautions given -pt discharged to home in stable condition  Elmyra Ricks E Kalven Ganim 04/27/2020, 9:20 PM

## 2020-04-27 NOTE — Discharge Instructions (Signed)
Abdominal Pain During Pregnancy  Abdominal pain is common during pregnancy, and has many possible causes. Some causes are more serious than others, and sometimes the cause is not known. Abdominal pain can be a sign that labor is starting. It can also be caused by normal growth and stretching of muscles and ligaments during pregnancy. Always tell your health care provider if you have any abdominal pain. Follow these instructions at home:  Do not have sex or put anything in your vagina until your pain goes away completely.  Get plenty of rest until your pain improves.  Drink enough fluid to keep your urine pale yellow.  Take over-the-counter and prescription medicines only as told by your health care provider.  Keep all follow-up visits as told by your health care provider. This is important. Contact a health care provider if:  Your pain continues or gets worse after resting.  You have lower abdominal pain that: ? Comes and goes at regular intervals. ? Spreads to your back. ? Is similar to menstrual cramps.  You have pain or burning when you urinate. Get help right away if:  You have a fever or chills.  You have vaginal bleeding.  You are leaking fluid from your vagina.  You are passing tissue from your vagina.  You have vomiting or diarrhea that lasts for more than 24 hours.  Your baby is moving less than usual.  You feel very weak or faint.  You have shortness of breath.  You develop severe pain in your upper abdomen. Summary  Abdominal pain is common during pregnancy, and has many possible causes.  If you experience abdominal pain during pregnancy, tell your health care provider right away.  Follow your health care provider's home care instructions and keep all follow-up visits as directed. This information is not intended to replace advice given to you by your health care provider. Make sure you discuss any questions you have with your health care  provider. Document Revised: 12/10/2018 Document Reviewed: 11/24/2016 Elsevier Patient Education  2020 Else[employer] Inc[employer]        Preterm Labor and Birth Information  The normal length of a pregnancy is 39-41 weeks. Preterm labor is when labor starts before 37 completed weeks of pregnancy. What are the risk factors for preterm labor? Preterm labor is more likely to occur in women who:  Have certain infections during pregnancy such as a bladder infection, sexually transmitted infection, or infection inside the uterus (chorioamnionitis).  Have a shorter-than-normal cervix.  Have gone into preterm labor before.  Have had surgery on their cervix.  Are younger than age 17 or older than age 35.  Are African American.  Are pregnant with twins or multiple babies (multiple gestation).  Take street drugs or smoke while pregnant.  Do not gain enough weight while pregnant.  Became pregnant shortly after having been pregnant. What are the symptoms of preterm labor? Symptoms of preterm labor include:  Cramps similar to those that can happen during a menstrual period. The cramps may happen with diarrhea.  Pain in the abdomen or lower back.  Regular uterine contractions that may feel like tightening of the abdomen.  A feeling of increased pressure in the pelvis.  Increased watery or bloody mucus discharge from the vagina.  Water breaking (ruptured amniotic sac). Why is it important to recognize signs of preterm labor? It is important to recognize signs of preterm labor because babies who are born prematurely may not be fully developed. This can put them at   an increased risk for:  Long-term (chronic) heart and lung problems.  Difficulty immediately after birth with regulating body systems, including blood sugar, body temperature, heart rate, and breathing rate.  Bleeding in the brain.  Cerebral palsy.  Learning difficulties.  Death. These risks are highest for babies who are  born before 34 weeks of pregnancy. How is preterm labor treated? Treatment depends on the length of your pregnancy, your condition, and the health of your baby. It may involve:  Having a stitch (suture) placed in your cervix to prevent your cervix from opening too early (cerclage).  Taking or being given medicines, such as: ? Hormone medicines. These may be given early in pregnancy to help support the pregnancy. ? Medicine to stop contractions. ? Medicines to help mature the baby's lungs. These may be prescribed if the risk of delivery is high. ? Medicines to prevent your baby from developing cerebral palsy. If the labor happens before 34 weeks of pregnancy, you may need to stay in the hospital. What should I do if I think I am in preterm labor? If you think that you are going into preterm labor, call your health care provider right away. How can I prevent preterm labor in future pregnancies? To increase your chance of having a full-term pregnancy:  Do not use any tobacco products, such as cigarettes, chewing tobacco, and e-cigarettes. If you need help quitting, ask your health care provider.  Do not use street drugs or medicines that have not been prescribed to you during your pregnancy.  Talk with your health care provider before taking any herbal supplements, even if you have been taking them regularly.  Make sure you gain a healthy amount of weight during your pregnancy.  Watch for infection. If you think that you might have an infection, get it checked right away.  Make sure to tell your health care provider if you have gone into preterm labor before. This information is not intended to replace advice given to you by your health care provider. Make sure you discuss any questions you have with your health care provider. Document Revised: 12/14/2018 Document Reviewed: 01/13/2016 Elsevier Patient Education  2020 Else[employer] Inc.        Cervical Cerclage  Cervical cerclage is a  surgical procedure to correct a cervix that opens up and thins out before pregnancy is at term. This is also called cervical insufficiency, or incompetent cervix. This condition can cause labor to start early (prematurely). In this procedure, a health care provider uses stitches (sutures) to sew the cervix shut during pregnancy. Your health care provider may use ultrasound to help guide the procedure and monitor your baby. Ultrasound uses sound waves to take images of your cervix and uterus. The health care provider will assess these images on a monitor in the operating room. Tell a health care provider about:  Any allergies you have, especially any allergies related to prescribed medicine, stitches, or anesthetic medicines.  Any problems you or family members have had with anesthetic medicines.  Any blood disorders you have.  Any surgeries you have had, including prior cervical stitching.  Any medical conditions you have or have had. What are the risks? Generally, this is a safe procedure. However, problems may occur, including:  Infection, such as infection of the cervix or the bag of fluid that surrounds the baby (amniotic sac).  Vaginal bleeding.  Allergic reactions to medicines.  Damage to nearby structures or organs, such as injury to the cervix or tearing of  the amniotic sac.  Contractions that come too early, including going into early labor and delivery.  Cervical dystocia. This occurs when the cervix is unable to open normally during labor. What happens before the procedure? Staying hydrated Follow instructions from your health care provider about hydration, which may include:  Up to 2 hours before the procedure - you may continue to drink clear liquids, such as water, clear fruit juice, black coffee, and plain tea.  Eating and drinking restrictions Follow instructions from your health care provider about eating and drinking, which may include:  8 hours before the  procedure - stop eating heavy meals or foods, such as meat, fried foods, or fatty foods.  6 hours before the procedure - stop eating light meals or foods, such as toast or cereal.  6 hours before the procedure - stop drinking milk or drinks that contain milk.  2 hours before the procedure - stop drinking clear liquids. Medicines Ask your health care provider about:  Changing or stopping your regular medicines. This is especially important if you are taking diabetes medicines or blood thinners.  Taking medicines such as aspirin and ibuprofen. These medicines can thin your blood. Do not take these medicines unless your health care provider tells you to take them.  Taking over-the-counter medicines, vitamins, herbs, and supplements. Surgery safety Ask your health care provider:  How your surgery site will be marked.  What steps will be taken to help prevent infection. These may include: ? Removing hair at the surgery site. ? Washing skin with a germ-killing soap. ? Taking antibiotic medicine. General instructions  Do not put on any lotion, deodorant, or perfume.  Remove contact lenses and jewelry.  You may have an exam or testing, including blood or urine tests.  Plan to have someone take you home from the hospital or clinic.  If you will be going home right after the procedure, plan to have someone with you for 24 hours. What happens during the procedure?  An IV will be inserted into one of your veins.  You may be given one or more of the following: ? A medicine to help you relax (sedative). ? A medicine to numb the area (local anesthetic). ? A medicine to make you fall asleep (general anesthetic). ? A medicine that is injected into your spine to numb the area below and slightly above the injection site (spinal anesthetic).  A lubricated instrument (speculum) will be inserted into your vagina. The speculum will be widened to open the walls of your vagina so your surgeon can  see your cervix.  Your cervix will be grasped and tightly sutured to close it. To do this, your surgeon will stitch a strong band of thread around your cervix, then the thread will be tightened to hold your cervix shut. The procedure may vary among health care providers and hospitals. What happens after the procedure?  Your blood pressure, heart rate, breathing rate, and blood oxygen level will be monitored until you leave the hospital or clinic.  You will be monitored for premature contractions.  You may have light bleeding and mild cramping.  You may have to wear compression stockings. These stockings help to prevent blood clots and reduce swelling in your legs.  If you were given a sedative during the procedure, it can affect you for several hours. Do not drive or operate machinery until your health care provider says that it is safe.  You may be put on bed rest.  You may  be given an injection of a hormone (progesterone) to prevent premature contractions. Summary  Cervical cerclage is a surgical procedure in which stitches are used to sew the cervix shut during pregnancy.  Before the procedure, tell your health care provider about your medicines, or medical problems or blood disorders that you have.  This is a safe procedure. However, problems may occur, including infection, bleeding, or premature labor.  Follow all instructions about eating and drinking before the procedure. Plan to have someone drive you home from the hospital or clinic. This information is not intended to replace advice given to you by your health care provider. Make sure you discuss any questions you have with your health care provider. Document Revised: 06/18/2019 Document Reviewed: 04/17/2019 Elsevier Patient Education  2020 [employer].

## 2020-04-28 LAB — GC/CHLAMYDIA PROBE AMP (~~LOC~~) NOT AT ARMC
Chlamydia: NEGATIVE
Comment: NEGATIVE
Comment: NORMAL
Neisseria Gonorrhea: NEGATIVE

## 2020-04-28 LAB — CULTURE, OB URINE: Culture: NO GROWTH

## 2020-04-29 ENCOUNTER — Ambulatory Visit: Payer: Medicaid Other

## 2020-05-01 ENCOUNTER — Other Ambulatory Visit: Payer: Self-pay

## 2020-05-01 ENCOUNTER — Ambulatory Visit: Payer: Medicaid Other | Attending: Obstetrics and Gynecology | Admitting: *Deleted

## 2020-05-01 ENCOUNTER — Ambulatory Visit (HOSPITAL_BASED_OUTPATIENT_CLINIC_OR_DEPARTMENT_OTHER): Payer: Medicaid Other

## 2020-05-01 DIAGNOSIS — O360130 Maternal care for anti-D [Rh] antibodies, third trimester, not applicable or unspecified: Secondary | ICD-10-CM | POA: Insufficient documentation

## 2020-05-01 DIAGNOSIS — Z3A32 32 weeks gestation of pregnancy: Secondary | ICD-10-CM

## 2020-05-01 DIAGNOSIS — O3433 Maternal care for cervical incompetence, third trimester: Secondary | ICD-10-CM | POA: Insufficient documentation

## 2020-05-01 DIAGNOSIS — E669 Obesity, unspecified: Secondary | ICD-10-CM

## 2020-05-01 DIAGNOSIS — Z6836 Body mass index (BMI) 36.0-36.9, adult: Secondary | ICD-10-CM

## 2020-05-01 DIAGNOSIS — O99213 Obesity complicating pregnancy, third trimester: Secondary | ICD-10-CM | POA: Insufficient documentation

## 2020-05-01 DIAGNOSIS — O99210 Obesity complicating pregnancy, unspecified trimester: Secondary | ICD-10-CM

## 2020-05-01 DIAGNOSIS — Z302 Encounter for sterilization: Secondary | ICD-10-CM

## 2020-05-01 DIAGNOSIS — Z362 Encounter for other antenatal screening follow-up: Secondary | ICD-10-CM | POA: Diagnosis not present

## 2020-05-05 ENCOUNTER — Other Ambulatory Visit: Payer: Self-pay

## 2020-05-05 ENCOUNTER — Encounter: Payer: Self-pay | Admitting: Obstetrics and Gynecology

## 2020-05-05 ENCOUNTER — Ambulatory Visit (INDEPENDENT_AMBULATORY_CARE_PROVIDER_SITE_OTHER): Payer: Medicaid Other | Admitting: Obstetrics and Gynecology

## 2020-05-05 VITALS — BP 118/79 | HR 118 | Wt 184.0 lb

## 2020-05-05 DIAGNOSIS — Z6791 Unspecified blood type, Rh negative: Secondary | ICD-10-CM

## 2020-05-05 DIAGNOSIS — O3430 Maternal care for cervical incompetence, unspecified trimester: Secondary | ICD-10-CM

## 2020-05-05 DIAGNOSIS — O26899 Other specified pregnancy related conditions, unspecified trimester: Secondary | ICD-10-CM

## 2020-05-05 DIAGNOSIS — O0990 Supervision of high risk pregnancy, unspecified, unspecified trimester: Secondary | ICD-10-CM

## 2020-05-05 DIAGNOSIS — Z302 Encounter for sterilization: Secondary | ICD-10-CM

## 2020-05-05 NOTE — Progress Notes (Signed)
   PRENATAL VISIT NOTE  Subjective:  Theresa Fletcher is a 24 y.o. G3P1011 at [redacted]w[redacted]d being seen today for ongoing prenatal care.  She is currently monitored for the following issues for this high-risk pregnancy and has Rh negative state in antepartum period; Obesity in pregnancy; BMI 30s; Cervical insufficiency during pregnancy, antepartum; Subchorionic hematoma in first trimester; Supervision of high risk pregnancy, antepartum; Cervical cerclage suture present; Request for sterilization; and Maternal iron deficiency anemia complicating pregnancy in third trimester on their problem list.  Patient reports no complaints.  Contractions: Not present. Vag. Bleeding: None.  Movement: Present. Denies leaking of fluid.   The following portions of the patient's history were reviewed and updated as appropriate: allergies, current medications, past family history, past medical history, past social history, past surgical history and problem list.   Objective:   Vitals:   05/05/20 0833  BP: 118/79  Pulse: (!) 118  Weight: 184 lb (83.5 kg)    Fetal Status: Fetal Heart Rate (bpm): 142 Fundal Height: 32 cm Movement: Present     General:  Alert, oriented and cooperative. Patient is in no acute distress.  Skin: Skin is warm and dry. No rash noted.   Cardiovascular: Normal heart rate noted  Respiratory: Normal respiratory effort, no problems with respiration noted  Abdomen: Soft, gravid, appropriate for gestational age.  Pain/Pressure: Absent     Pelvic: Cervical exam deferred        Extremities: Normal range of motion.  Edema: None  Mental Status: Normal mood and affect. Normal behavior. Normal judgment and thought content.   Assessment and Plan:  Pregnancy: G3P1011 at [redacted]w[redacted]d 1. Supervision of high risk pregnancy, antepartum Patient is doing well without complaints Discussed cerclage removal at 36-37 weeks  2. Cervical insufficiency during pregnancy, antepartum Cerclage in place Continue  prometrium  3. Rh negative state in antepartum period S/p rhogam  4. Request for sterilization Forms signed in July  Preterm labor symptoms and general obstetric precautions including but not limited to vaginal bleeding, contractions, leaking of fluid and fetal movement were reviewed in detail with the patient. Please refer to After Visit Summary for other counseling recommendations.   Return in about 2 weeks (around 05/19/2020) for in person, ROB, High risk.  No future appointments.  Catalina Antigua, MD

## 2020-05-19 ENCOUNTER — Encounter: Payer: Medicaid Other | Admitting: Obstetrics & Gynecology

## 2020-05-20 ENCOUNTER — Encounter: Payer: Self-pay | Admitting: Family Medicine

## 2020-05-20 ENCOUNTER — Other Ambulatory Visit: Payer: Self-pay

## 2020-05-20 ENCOUNTER — Ambulatory Visit (INDEPENDENT_AMBULATORY_CARE_PROVIDER_SITE_OTHER): Payer: Medicaid Other | Admitting: Family Medicine

## 2020-05-20 ENCOUNTER — Other Ambulatory Visit (HOSPITAL_COMMUNITY)
Admission: RE | Admit: 2020-05-20 | Discharge: 2020-05-20 | Disposition: A | Discharge status: Home / Self Care | Payer: Medicaid Other | Source: Ambulatory Visit | Attending: Obstetrics & Gynecology | Admitting: Obstetrics & Gynecology

## 2020-05-20 VITALS — BP 119/81 | HR 112 | Wt 186.0 lb

## 2020-05-20 DIAGNOSIS — N898 Other specified noninflammatory disorders of vagina: Secondary | ICD-10-CM | POA: Diagnosis not present

## 2020-05-20 DIAGNOSIS — Z6791 Unspecified blood type, Rh negative: Secondary | ICD-10-CM

## 2020-05-20 DIAGNOSIS — O0990 Supervision of high risk pregnancy, unspecified, unspecified trimester: Secondary | ICD-10-CM

## 2020-05-20 DIAGNOSIS — O3430 Maternal care for cervical incompetence, unspecified trimester: Secondary | ICD-10-CM

## 2020-05-20 DIAGNOSIS — O26899 Other specified pregnancy related conditions, unspecified trimester: Secondary | ICD-10-CM

## 2020-05-20 NOTE — Patient Instructions (Signed)

## 2020-05-20 NOTE — Progress Notes (Signed)
   PRENATAL VISIT NOTE  Subjective:  Theresa Fletcher is a 24 y.o. G3P1011 at [redacted]w[redacted]d being seen today for ongoing prenatal care.  She is currently monitored for the following issues for this high-risk pregnancy and has Rh negative state in antepartum period; Obesity in pregnancy; BMI 30s; Cervical insufficiency during pregnancy, antepartum; Subchorionic hematoma in first trimester; Supervision of high risk pregnancy, antepartum; Cervical cerclage suture present; Request for sterilization; and Maternal iron deficiency anemia complicating pregnancy in third trimester on their problem list.  Patient reports contractions since 2 night ago with bloody show.  Contractions: Irregular. Vag. Bleeding: Bloody Show.  Movement: Present. Denies leaking of fluid.   The following portions of the patient's history were reviewed and updated as appropriate: allergies, current medications, past family history, past medical history, past social history, past surgical history and problem list.   Objective:   Vitals:   05/20/20 1540  BP: 119/81  Pulse: (!) 112  Weight: 186 lb (84.4 kg)    Fetal Status: Fetal Heart Rate (bpm): 153 Fundal Height: 33 cm Movement: Present  Presentation: Vertex  General:  Alert, oriented and cooperative. Patient is in no acute distress.  Skin: Skin is warm and dry. No rash noted.   Cardiovascular: Normal heart rate noted  Respiratory: Normal respiratory effort, no problems with respiration noted  Abdomen: Soft, gravid, appropriate for gestational age.  Pain/Pressure: Present     Pelvic: Cervical exam performed in the presence of a chaperone Dilation: 1 Effacement (%): 80 Station: -2 cerclage with tension and part of it felt to be pulled through  Extremities: Normal range of motion.  Edema: None  Mental Status: Normal mood and affect. Normal behavior. Normal judgment and thought content.  Procedure: Speculum placed inside vagina. Cervix visualized. Cerclage seen at top of cervix,  grasped and cut. Removed intact. Post procedure cervix is 1-2/80-1 Assessment and Plan:  Pregnancy: G3P1011 at [redacted]w[redacted]d 1. Cervical insufficiency during pregnancy, antepartum Removed today due to on-going contractions and tension on the stitch. Continue prometrium  2. Rh negative state in antepartum period S/p Rhogam  3. Supervision of high risk pregnancy, antepartum GBS next visit  4. Vaginal discharge Check swabs. - Cervicovaginal ancillary only( [place])  Preterm labor symptoms and general obstetric precautions including but not limited to vaginal bleeding, contractions, leaking of fluid and fetal movement were reviewed in detail with the patient. Please refer to After Visit Summary for other counseling recommendations.   Return in 2 weeks (on 06/03/2020) for [facility], in person.  Future Appointments  Date Time Provider Department Center  06/04/2020  2:00 PM Anyanwu, Jethro Bastos, MD [facility]    Reva Bores, MD

## 2020-05-22 LAB — CERVICOVAGINAL ANCILLARY ONLY
Bacterial Vaginitis (gardnerella): NEGATIVE
Candida Glabrata: NEGATIVE
Candida Vaginitis: NEGATIVE
Chlamydia: NEGATIVE
Comment: NEGATIVE
Comment: NEGATIVE
Comment: NEGATIVE
Comment: NEGATIVE
Comment: NEGATIVE
Comment: NORMAL
Neisseria Gonorrhea: NEGATIVE
Trichomonas: NEGATIVE

## 2020-06-04 ENCOUNTER — Ambulatory Visit (INDEPENDENT_AMBULATORY_CARE_PROVIDER_SITE_OTHER): Payer: Medicaid Other | Admitting: Obstetrics & Gynecology

## 2020-06-04 ENCOUNTER — Other Ambulatory Visit: Payer: Self-pay

## 2020-06-04 VITALS — BP 126/87 | HR 106 | Wt 189.2 lb

## 2020-06-04 DIAGNOSIS — O0990 Supervision of high risk pregnancy, unspecified, unspecified trimester: Secondary | ICD-10-CM

## 2020-06-04 DIAGNOSIS — Z3A37 37 weeks gestation of pregnancy: Secondary | ICD-10-CM

## 2020-06-04 NOTE — Addendum Note (Signed)
Addended by: Leola Brazil on: 06/04/2020 02:58 PM   Modules accepted: Orders

## 2020-06-04 NOTE — Progress Notes (Signed)
   PRENATAL VISIT NOTE  Subjective:  Theresa Fletcher is a 24 y.o. G3P1011 at [redacted]w[redacted]d being seen today for ongoing prenatal care.  She is currently monitored for the following issues for this high-risk pregnancy and has Rh negative state in antepartum period; Obesity in pregnancy; BMI 30s; Cervical insufficiency during pregnancy, antepartum; Subchorionic hematoma in first trimester; Supervision of high risk pregnancy, antepartum; Cervical cerclage suture present; Request for sterilization; and Maternal iron deficiency anemia complicating pregnancy in third trimester on their problem list.  Patient reports no complaints.  Contractions: Irregular. Vag. Bleeding: None.  Movement: Present. Denies leaking of fluid.   The following portions of the patient's history were reviewed and updated as appropriate: allergies, current medications, past family history, past medical history, past social history, past surgical history and problem list.   Objective:   Vitals:   06/04/20 1413  BP: 126/87  Pulse: (!) 106  Weight: 189 lb 3.2 oz (85.8 kg)    Fetal Status: Fetal Heart Rate (bpm): 150 Fundal Height: 37 cm Movement: Present  Presentation: Vertex  General:  Alert, oriented and cooperative. Patient is in no acute distress.  Skin: Skin is warm and dry. No rash noted.   Cardiovascular: Normal heart rate noted  Respiratory: Normal respiratory effort, no problems with respiration noted  Abdomen: Soft, gravid, appropriate for gestational age.  Pain/Pressure: Present     Pelvic: Cervical exam performed in the presence of a chaperone Dilation: 1.5 Effacement (%): 80 Station: -2  Extremities: Normal range of motion.  Edema: None  Mental Status: Normal mood and affect. Normal behavior. Normal judgment and thought content.   Assessment and Plan:  Pregnancy: G3P1011 at [redacted]w[redacted]d 1. [redacted] weeks gestation of pregnancy 2. Supervision of high risk pregnancy, antepartum GBS done today, had negative GC/Chlam last  week. Labor symptoms and general obstetric precautions including but not limited to vaginal bleeding, contractions, leaking of fluid and fetal movement were reviewed in detail with the patient. Please refer to After Visit Summary for other counseling recommendations.   Return in about 1 week (around 06/11/2020) for OFFICE OB Visit.  No future appointments.  Jaynie Collins, MD

## 2020-06-04 NOTE — Patient Instructions (Signed)
Return to office for any scheduled appointments. Call the office or go to the MAU at [facility] if:  You begin to have strong, frequent contractions  Your water breaks.  Sometimes it is a big gush of fluid, sometimes it is just a trickle that keeps getting your panties wet or running down your legs  You have vaginal bleeding.  It is normal to have a small amount of spotting if your cervix was checked.   You do not feel your baby moving like normal.  If you do not, get something to eat and drink and lay down and focus on feeling your baby move.   If your baby is still not moving like normal, you should call the office or go to MAU.  Any other obstetric concerns.   

## 2020-06-06 ENCOUNTER — Encounter: Payer: Self-pay | Admitting: Obstetrics & Gynecology

## 2020-06-06 DIAGNOSIS — O99820 Streptococcus B carrier state complicating pregnancy: Secondary | ICD-10-CM | POA: Insufficient documentation

## 2020-06-06 LAB — STREP GP B NAA: Strep Gp B NAA: POSITIVE — AB

## 2020-06-07 ENCOUNTER — Inpatient Hospital Stay (HOSPITAL_COMMUNITY): Payer: Medicaid Other | Admitting: Anesthesiology

## 2020-06-07 ENCOUNTER — Other Ambulatory Visit: Payer: Self-pay

## 2020-06-07 ENCOUNTER — Encounter (HOSPITAL_COMMUNITY)
Admission: AD | Disposition: A | Discharge status: Home / Self Care | Payer: Self-pay | Source: Home / Self Care | Attending: Obstetrics & Gynecology

## 2020-06-07 ENCOUNTER — Inpatient Hospital Stay (HOSPITAL_COMMUNITY)
Admission: AD | Admit: 2020-06-07 | Discharge: 2020-06-09 | DRG: 798 | Disposition: A | Discharge status: Home / Self Care | Payer: Medicaid Other | Attending: Obstetrics & Gynecology | Admitting: Obstetrics & Gynecology

## 2020-06-07 ENCOUNTER — Encounter (HOSPITAL_COMMUNITY): Payer: Self-pay | Admitting: Obstetrics and Gynecology

## 2020-06-07 DIAGNOSIS — E669 Obesity, unspecified: Secondary | ICD-10-CM | POA: Diagnosis present

## 2020-06-07 DIAGNOSIS — Z20822 Contact with and (suspected) exposure to covid-19: Secondary | ICD-10-CM | POA: Diagnosis present

## 2020-06-07 DIAGNOSIS — O26893 Other specified pregnancy related conditions, third trimester: Secondary | ICD-10-CM | POA: Diagnosis present

## 2020-06-07 DIAGNOSIS — Z3A37 37 weeks gestation of pregnancy: Secondary | ICD-10-CM

## 2020-06-07 DIAGNOSIS — O26899 Other specified pregnancy related conditions, unspecified trimester: Secondary | ICD-10-CM

## 2020-06-07 DIAGNOSIS — Z302 Encounter for sterilization: Secondary | ICD-10-CM | POA: Diagnosis not present

## 2020-06-07 DIAGNOSIS — O9902 Anemia complicating childbirth: Secondary | ICD-10-CM | POA: Diagnosis present

## 2020-06-07 DIAGNOSIS — O99820 Streptococcus B carrier state complicating pregnancy: Secondary | ICD-10-CM

## 2020-06-07 DIAGNOSIS — Z6791 Unspecified blood type, Rh negative: Secondary | ICD-10-CM | POA: Diagnosis not present

## 2020-06-07 DIAGNOSIS — O99824 Streptococcus B carrier state complicating childbirth: Secondary | ICD-10-CM | POA: Diagnosis present

## 2020-06-07 DIAGNOSIS — D509 Iron deficiency anemia, unspecified: Secondary | ICD-10-CM | POA: Diagnosis present

## 2020-06-07 DIAGNOSIS — O99214 Obesity complicating childbirth: Secondary | ICD-10-CM | POA: Diagnosis present

## 2020-06-07 DIAGNOSIS — O3430 Maternal care for cervical incompetence, unspecified trimester: Secondary | ICD-10-CM | POA: Diagnosis present

## 2020-06-07 HISTORY — PX: TUBAL LIGATION: SHX77

## 2020-06-07 LAB — CBC
HCT: 32.7 % — ABNORMAL LOW (ref 36.0–46.0)
Hemoglobin: 10.6 g/dL — ABNORMAL LOW (ref 12.0–15.0)
MCH: 29.7 pg (ref 26.0–34.0)
MCHC: 32.4 g/dL (ref 30.0–36.0)
MCV: 91.6 fL (ref 80.0–100.0)
Platelets: 223 10*3/uL (ref 150–400)
RBC: 3.57 MIL/uL — ABNORMAL LOW (ref 3.87–5.11)
RDW: 14.3 % (ref 11.5–15.5)
WBC: 10.5 10*3/uL (ref 4.0–10.5)
nRBC: 0 % (ref 0.0–0.2)

## 2020-06-07 LAB — TYPE AND SCREEN
ABO/RH(D): O NEG
Antibody Screen: NEGATIVE

## 2020-06-07 LAB — RESPIRATORY PANEL BY RT PCR (FLU A&B, COVID)
Influenza A by PCR: NEGATIVE
Influenza B by PCR: NEGATIVE
SARS Coronavirus 2 by RT PCR: NEGATIVE

## 2020-06-07 LAB — RPR: RPR Ser Ql: NONREACTIVE

## 2020-06-07 SURGERY — LIGATION, FALLOPIAN TUBE, POSTPARTUM
Anesthesia: Epidural

## 2020-06-07 MED ORDER — HYDROMORPHONE HCL 1 MG/ML IJ SOLN
0.2500 mg | INTRAMUSCULAR | Status: DC | PRN
  Administered 2020-06-07: 0.5 mg via INTRAVENOUS

## 2020-06-07 MED ORDER — LIDOCAINE HCL (PF) 1 % IJ SOLN: 30.0000 mL | INTRAMUSCULAR | Status: DC | PRN

## 2020-06-07 MED ORDER — BUPIVACAINE HCL (PF) 0.5 % IJ SOLN
INTRAMUSCULAR | Status: AC
  Filled 2020-06-07: qty 30

## 2020-06-07 MED ORDER — SENNOSIDES-DOCUSATE SODIUM 8.6-50 MG PO TABS
2.0000 | ORAL_TABLET | ORAL | Status: DC
  Administered 2020-06-07 – 2020-06-08 (×2): 2 via ORAL
  Filled 2020-06-07 (×2): qty 2

## 2020-06-07 MED ORDER — KETOROLAC TROMETHAMINE 30 MG/ML IJ SOLN
INTRAMUSCULAR | Status: AC
  Filled 2020-06-07: qty 1

## 2020-06-07 MED ORDER — ONDANSETRON HCL 4 MG/2ML IJ SOLN: 4.0000 mg | Freq: Four times a day (QID) | INTRAMUSCULAR | Status: DC | PRN

## 2020-06-07 MED ORDER — LACTATED RINGERS IV SOLN: INTRAVENOUS | Status: DC

## 2020-06-07 MED ORDER — LIDOCAINE 2% (20 MG/ML) 5 ML SYRINGE
INTRAMUSCULAR | Status: DC | PRN
  Administered 2020-06-07 (×2): 4 mL via INTRAVENOUS
  Administered 2020-06-07 (×2): 2 mL via INTRAVENOUS
  Administered 2020-06-07: 3 mL via INTRAVENOUS

## 2020-06-07 MED ORDER — BUPIVACAINE HCL 0.5 % IJ SOLN
INTRAMUSCULAR | Status: DC | PRN
  Administered 2020-06-07: 10 mL

## 2020-06-07 MED ORDER — OXYTOCIN-SODIUM CHLORIDE 30-0.9 UT/500ML-% IV SOLN
INTRAVENOUS | Status: AC
  Filled 2020-06-07: qty 500

## 2020-06-07 MED ORDER — SOD CITRATE-CITRIC ACID 500-334 MG/5ML PO SOLN
30.0000 mL | ORAL | Status: DC | PRN
  Administered 2020-06-07: 30 mL via ORAL
  Filled 2020-06-07: qty 15

## 2020-06-07 MED ORDER — OXYTOCIN-SODIUM CHLORIDE 30-0.9 UT/500ML-% IV SOLN
INTRAVENOUS | Status: DC | PRN
  Administered 2020-06-07: 50 mL via INTRAVENOUS
  Administered 2020-06-07: 30 [IU] via INTRAVENOUS

## 2020-06-07 MED ORDER — FENTANYL CITRATE (PF) 100 MCG/2ML IJ SOLN
INTRAMUSCULAR | Status: AC
  Filled 2020-06-07: qty 2

## 2020-06-07 MED ORDER — PENICILLIN G POT IN DEXTROSE 60000 UNIT/ML IV SOLN
3.0000 10*6.[IU] | INTRAVENOUS | Status: DC
  Administered 2020-06-07: 3 10*6.[IU] via INTRAVENOUS
  Filled 2020-06-07: qty 50

## 2020-06-07 MED ORDER — ACETAMINOPHEN 325 MG PO TABS: 650.0000 mg | ORAL_TABLET | ORAL | Status: DC | PRN

## 2020-06-07 MED ORDER — LIDOCAINE HCL (PF) 2 % IJ SOLN
INTRAMUSCULAR | Status: AC
  Filled 2020-06-07: qty 20

## 2020-06-07 MED ORDER — PROMETHAZINE HCL 25 MG/ML IJ SOLN: 6.2500 mg | INTRAMUSCULAR | Status: DC | PRN

## 2020-06-07 MED ORDER — PHENYLEPHRINE 40 MCG/ML (10ML) SYRINGE FOR IV PUSH (FOR BLOOD PRESSURE SUPPORT): 80.0000 ug | PREFILLED_SYRINGE | INTRAVENOUS | Status: DC | PRN

## 2020-06-07 MED ORDER — OXYTOCIN-SODIUM CHLORIDE 30-0.9 UT/500ML-% IV SOLN
2.5000 [IU]/h | INTRAVENOUS | Status: DC
  Filled 2020-06-07: qty 500

## 2020-06-07 MED ORDER — DIPHENHYDRAMINE HCL 50 MG/ML IJ SOLN: 12.5000 mg | INTRAMUSCULAR | Status: DC | PRN

## 2020-06-07 MED ORDER — DIPHENHYDRAMINE HCL 25 MG PO CAPS: 25.0000 mg | ORAL_CAPSULE | Freq: Four times a day (QID) | ORAL | Status: DC | PRN

## 2020-06-07 MED ORDER — OXYTOCIN BOLUS FROM INFUSION
333.0000 mL | Freq: Once | INTRAVENOUS | Status: AC
  Administered 2020-06-07: 333 mL via INTRAVENOUS

## 2020-06-07 MED ORDER — TETANUS-DIPHTH-ACELL PERTUSSIS 5-2.5-18.5 LF-MCG/0.5 IM SUSP: 0.5000 mL | Freq: Once | INTRAMUSCULAR | Status: DC

## 2020-06-07 MED ORDER — FENTANYL-BUPIVACAINE-NACL 0.5-0.125-0.9 MG/250ML-% EP SOLN: 12.0000 mL/h | EPIDURAL | Status: DC | PRN

## 2020-06-07 MED ORDER — BENZOCAINE-MENTHOL 20-0.5 % EX AERO: 1.0000 "application " | INHALATION_SPRAY | CUTANEOUS | Status: DC | PRN

## 2020-06-07 MED ORDER — ONDANSETRON HCL 4 MG/2ML IJ SOLN
INTRAMUSCULAR | Status: DC | PRN
  Administered 2020-06-07: 4 mg via INTRAVENOUS

## 2020-06-07 MED ORDER — PHENYLEPHRINE 40 MCG/ML (10ML) SYRINGE FOR IV PUSH (FOR BLOOD PRESSURE SUPPORT)
PREFILLED_SYRINGE | INTRAVENOUS | Status: AC
  Filled 2020-06-07: qty 10

## 2020-06-07 MED ORDER — OXYCODONE HCL 5 MG/5ML PO SOLN: 5.0000 mg | Freq: Once | ORAL | Status: DC | PRN

## 2020-06-07 MED ORDER — EPHEDRINE 5 MG/ML INJ: 10.0000 mg | INTRAVENOUS | Status: DC | PRN

## 2020-06-07 MED ORDER — FLEET ENEMA 7-19 GM/118ML RE ENEM: 1.0000 | ENEMA | RECTAL | Status: DC | PRN

## 2020-06-07 MED ORDER — LIDOCAINE HCL (PF) 1 % IJ SOLN
INTRAMUSCULAR | Status: DC | PRN
  Administered 2020-06-07: 11 mL via EPIDURAL

## 2020-06-07 MED ORDER — ONDANSETRON HCL 4 MG PO TABS: 4.0000 mg | ORAL_TABLET | ORAL | Status: DC | PRN

## 2020-06-07 MED ORDER — PRENATAL MULTIVITAMIN CH
1.0000 | ORAL_TABLET | Freq: Every day | ORAL | Status: DC
  Administered 2020-06-08: 1 via ORAL
  Filled 2020-06-07: qty 1

## 2020-06-07 MED ORDER — ONDANSETRON HCL 4 MG/2ML IJ SOLN
INTRAMUSCULAR | Status: AC
  Filled 2020-06-07: qty 2

## 2020-06-07 MED ORDER — HYDROMORPHONE HCL 1 MG/ML IJ SOLN
INTRAMUSCULAR | Status: AC
  Filled 2020-06-07: qty 0.5

## 2020-06-07 MED ORDER — DIBUCAINE (PERIANAL) 1 % EX OINT: 1.0000 "application " | TOPICAL_OINTMENT | CUTANEOUS | Status: DC | PRN

## 2020-06-07 MED ORDER — OXYCODONE HCL 5 MG PO TABS: 5.0000 mg | ORAL_TABLET | Freq: Once | ORAL | Status: DC | PRN

## 2020-06-07 MED ORDER — SIMETHICONE 80 MG PO CHEW: 80.0000 mg | CHEWABLE_TABLET | ORAL | Status: DC | PRN

## 2020-06-07 MED ORDER — KETOROLAC TROMETHAMINE 30 MG/ML IJ SOLN
INTRAMUSCULAR | Status: DC | PRN
  Administered 2020-06-07: 30 mg via INTRAVENOUS

## 2020-06-07 MED ORDER — OXYCODONE-ACETAMINOPHEN 5-325 MG PO TABS: 2.0000 | ORAL_TABLET | ORAL | Status: DC | PRN

## 2020-06-07 MED ORDER — KETOROLAC TROMETHAMINE 30 MG/ML IJ SOLN: 30.0000 mg | Freq: Once | INTRAMUSCULAR | Status: DC | PRN

## 2020-06-07 MED ORDER — IBUPROFEN 600 MG PO TABS
600.0000 mg | ORAL_TABLET | Freq: Four times a day (QID) | ORAL | Status: DC
  Administered 2020-06-07 – 2020-06-09 (×6): 600 mg via ORAL
  Filled 2020-06-07 (×6): qty 1

## 2020-06-07 MED ORDER — SODIUM CHLORIDE 0.9 % IV SOLN
5.0000 10*6.[IU] | Freq: Once | INTRAVENOUS | Status: AC
  Administered 2020-06-07: 5 10*6.[IU] via INTRAVENOUS
  Filled 2020-06-07: qty 5

## 2020-06-07 MED ORDER — FENTANYL-BUPIVACAINE-NACL 0.5-0.125-0.9 MG/250ML-% EP SOLN
EPIDURAL | Status: AC
Start: 2020-06-07 — End: 2020-06-07
  Filled 2020-06-07: qty 250

## 2020-06-07 MED ORDER — EPINEPHRINE PF 1 MG/ML IJ SOLN
INTRAMUSCULAR | Status: AC
  Filled 2020-06-07: qty 1

## 2020-06-07 MED ORDER — FENTANYL CITRATE (PF) 100 MCG/2ML IJ SOLN: 100.0000 ug | INTRAMUSCULAR | Status: DC | PRN

## 2020-06-07 MED ORDER — OXYCODONE-ACETAMINOPHEN 5-325 MG PO TABS: 1.0000 | ORAL_TABLET | ORAL | Status: DC | PRN

## 2020-06-07 MED ORDER — WITCH HAZEL-GLYCERIN EX PADS: 1.0000 "application " | MEDICATED_PAD | CUTANEOUS | Status: DC | PRN

## 2020-06-07 MED ORDER — LACTATED RINGERS IV SOLN: 500.0000 mL | INTRAVENOUS | Status: DC | PRN

## 2020-06-07 MED ORDER — FENTANYL CITRATE (PF) 100 MCG/2ML IJ SOLN
INTRAMUSCULAR | Status: DC | PRN
Start: 2020-06-07 — End: 2020-06-07
  Administered 2020-06-07: 100 ug via INTRAVENOUS

## 2020-06-07 MED ORDER — LACTATED RINGERS IV SOLN: 500.0000 mL | Freq: Once | INTRAVENOUS | Status: DC

## 2020-06-07 MED ORDER — BUPIVACAINE HCL (PF) 0.75 % IJ SOLN
INTRAMUSCULAR | Status: DC | PRN
Start: 2020-06-07 — End: 2020-06-07
  Administered 2020-06-07: 12 mL/h via EPIDURAL

## 2020-06-07 MED ORDER — ZOLPIDEM TARTRATE 5 MG PO TABS: 5.0000 mg | ORAL_TABLET | Freq: Every evening | ORAL | Status: DC | PRN

## 2020-06-07 MED ORDER — ONDANSETRON HCL 4 MG/2ML IJ SOLN: 4.0000 mg | INTRAMUSCULAR | Status: DC | PRN

## 2020-06-07 MED ORDER — COCONUT OIL OIL: 1.0000 "application " | TOPICAL_OIL | Status: DC | PRN

## 2020-06-07 SURGICAL SUPPLY — 27 items
CLOTH BEACON ORANGE TIMEOUT ST (SAFETY) ×2 IMPLANT
DRSG OPSITE POSTOP 3X4 (GAUZE/BANDAGES/DRESSINGS) ×2 IMPLANT
DURAPREP 26ML APPLICATOR (WOUND CARE) ×2 IMPLANT
ELECT REM PT RETURN 9FT ADLT (ELECTROSURGICAL) ×2
ELECTRODE REM PT RTRN 9FT ADLT (ELECTROSURGICAL) ×1 IMPLANT
GLOVE BIO SURGEON STRL SZ7 (GLOVE) ×2 IMPLANT
GLOVE BIOGEL PI IND STRL 7.0 (GLOVE) ×2 IMPLANT
GLOVE BIOGEL PI INDICATOR 7.0 (GLOVE) ×2
GOWN STRL REUS W/TWL LRG LVL3 (GOWN DISPOSABLE) ×4 IMPLANT
NEEDLE HYPO 22GX1.5 SAFETY (NEEDLE) ×2 IMPLANT
NS IRRIG 1000ML POUR BTL (IV SOLUTION) ×2 IMPLANT
PACK ABDOMINAL MINOR (CUSTOM PROCEDURE TRAY) ×2 IMPLANT
PENCIL BUTTON HOLSTER BLD 10FT (ELECTRODE) ×2 IMPLANT
PROTECTOR NERVE ULNAR (MISCELLANEOUS) ×2 IMPLANT
RETRACTOR WOUND ALXS 19CM XSML (INSTRUMENTS) ×1 IMPLANT
RTRCTR WOUND ALEXIS 19CM XSML (INSTRUMENTS) ×2
SPONGE LAP 4X18 RFD (DISPOSABLE) IMPLANT
STRIP CLOSURE SKIN 1/4X3 (GAUZE/BANDAGES/DRESSINGS) ×2 IMPLANT
SUT MNCRL AB 4-0 PS2 18 (SUTURE) ×2 IMPLANT
SUT PLAIN 0 NONE (SUTURE) ×2 IMPLANT
SUT VIC AB 0 CT1 27 (SUTURE) ×1
SUT VIC AB 0 CT1 27XBRD ANBCTR (SUTURE) ×1 IMPLANT
SUT VICRYL 0 UR6 27IN ABS (SUTURE) ×2 IMPLANT
SYR CONTROL 10ML LL (SYRINGE) ×2 IMPLANT
TOWEL OR 17X24 6PK STRL BLUE (TOWEL DISPOSABLE) ×4 IMPLANT
TRAY FOLEY CATH SILVER 14FR (SET/KITS/TRAYS/PACK) ×2 IMPLANT
WATER STERILE IRR 1000ML POUR (IV SOLUTION) ×2 IMPLANT

## 2020-06-07 NOTE — H&P (Addendum)
Theresa Fletcher is a 24 y.o. female G3P1011 at [redacted]w[redacted]d presenting for regular contractions and cervical changes c/w spontaneous labor.    Labor started 0350 10/3. Good fetal movement. Denies ROM. Vaginal bleeding intermittently throughout the day, bright red with mucous then turned brown. Bled more today while having contractions. Denies headaches, vision changes, dyspnea, or lower extremity edema. Contractions are now every 1.5-3 minutes.   Prenatal History/Complications: Rh negative state in antepartum period; s/p Rhogam Cervical insufficiency during pregnancy, antepartum  Obesity in pregnancy; BMI 30s Subchorionic hematoma in first trimester Request for sterilization Maternal iron deficiency anemia complicating pregnancy in third trim   She received her prenatal care at [facility]: @[redacted]w[redacted]d , normal anatomy, cephalic presentation, 2166g, 08[birth year]28[birth year]1986 EFW  OB History     Gravida  3   Para  1   Term  1   Preterm      AB  1   Living  1      SAB  1   TAB      Ectopic      Multiple      Live Births  1           Nursing Staff Provider  Office Location  [place] Dating  LMP + 10 wk 44%  Language  English Anatomy US  WNL  Flu Vaccine   Genetic Screen  NIPS:   AFP:   First Screen:  Quad:    TDaP vaccine   7/28 Hgb A1C or  GTT Early 5.3 Third trimester Nml 2 hr GTT  Rhogam  7/28   LAB RESULTS     Blood Type O/Negative/-- (03/25 1047)   Feeding Plan Breast Antibody Positive, See Final Results (03/25 1047) Anti D--weak titer--likely related to Rhogam for [facility]  Contraception BTL Rubella 3.10 (03/25 1047)  Circumcision Yes RPR Non Reactive (03/25 1047)   Pediatrician  CFCC HBsAg Negative (03/25 1047)   Support Person FOB - Vincent HCVAb Negative  Prenatal Classes No HIV Non Reactive (03/25 1047)     BTL Consent 7/28 GBS    VBAC Consent  Pap     Hgb Electro  Neg Horizon  BP Cuff  CF Neg Horizon    SMA Neg Horizon    Waterbirth  [ ]  Class [ ]  Consent [ ]  CNM  visit    Past Medical History:  Diagnosis Date   Anemia    Breast lump on right side at 10 o'clock position 03/12/2019   Breast pain, right 03/12/2019   Past Surgical History:  Procedure Laterality Date   CERVICAL CERCLAGE N/A 12/19/2019   Procedure: CERCLAGE CERVICAL;  Surgeon: , MD;  Location: [facility] LD ORS;  Service: Gynecology;  Laterality: N/A;   DILATION AND EVACUATION N/A 06/09/2019   Procedure: DILATATION AND EVACUATION;  Surgeon: 05/13/2019, MD;  Location: [facility] LD ORS;  Service: Gynecology;  Laterality: N/A;   Family History: family history includes Diabetes in her maternal grandmother; Healthy in her father and mother; Hypertension in her maternal grandfather. Social History:  reports that she has never smoked. She has never used smokeless tobacco. She reports previous alcohol use. She reports previous drug use.     Maternal Diabetes: No Genetic Screening: Normal Maternal Ultrasounds/Referrals: Normal Fetal Ultrasounds or other Referrals:  Referred to Materal Fetal Medicine  Maternal Substance Abuse:  No Significant Maternal Medications:  Meds include: Progesterone Significant Maternal Lab Results:  Group B Strep positive and Rh negative Other Comments:  None  Maternal meds: progesterin, muscle relaxer, tylenol, prenatal multivitamin  Review of Systems  Constitutional: Negative for chills and fever.  HENT:       Negative for headache.   Respiratory: Negative for chest tightness and shortness of breath.   Cardiovascular: Negative for chest pain and leg swelling.  Gastrointestinal: Positive for abdominal pain (+) Suprapubic pain, (-) RUQ pain.  Neurological: Negative for dizziness.   Maternal Medical History:  Reason for admission: Contractions and vaginal bleeding.   Contractions: Onset was 6-12 hours ago.   Frequency: regular.   Duration is approximately 5 minutes.   Perceived severity is strong.    Fetal activity: Perceived fetal activity is  normal.   Last perceived fetal movement was within the past hour.    Prenatal complications: Bleeding.   Prenatal Complications - Diabetes: none.    Dilation: 6.5 Effacement (%): 100 Station: 0, -1 Exam by:: CDerrill Kay, RNC Blood pressure 114/74, pulse 86, temperature 98.2 F (36.8 C), temperature source Oral, resp. rate 14, last menstrual period 09/19/2019, SpO2 98 %, unknown if currently breastfeeding. Maternal Exam:  Uterine Assessment: Contraction strength is moderate.  Contraction frequency is regular.   Abdomen: Patient reports the following abdominal tenderness: epigastric.    Physical Exam Abdominal:     Tenderness: There is abdominal tenderness in the epigastric area.  Neurological:     Mental Status: She is alert.     General appearance: alert, cooperative, distress during contractions Presentation:cephalic per nurse SVE  Fetal monitoring: baseline 135, mod variability, no accels or decels Uterine activity: regular on toco q 1.5-3 minutes   Prenatal labs: ABO, Rh: --/--/O NEG (10/03 0910) Antibody: NEG (10/03 0910) Rubella: 3.10 (03/25 1047) RPR: NON REACTIVE (10/03 0914)  HBsAg: Negative (03/25 1047)  HIV: Non Reactive (07/28 0823)  GBS: Positive/-- (09/30 1515)  Genetic screening: low risk Anatomy US: normal scan, c/w dates, normal fluid  Assessment/Plan: #PMHx: Cerclage @ 13 weeks #Pain control: Epidural #FWB: Cat 2 #ID: PCN for GBS (+) #MOF: Breast feeding #MOC: PP BTL #Circ: desired  #Pediatrician: [facility] at [facility] 06/07/2020, 11:26 AM  Attestation of Supervision of Student:  I confirm that I have verified the information documented in the  resident's  note and that I have also personally reperformed the history, physical exam and all medical decision making activities.  I have verified that all services and findings are accurately documented in this student's note; and I agree with management and plan as outlined in the  documentation. I have also made any necessary editorial changes.  --Cerclage removed in office 05/20/2020 --Contractions spacing s/p epidural. Consider Pitocin augmentation PRN --Patient sleeping --Anticipate NSVD  Calvert Cantor, CNM [facility] [employer], [facility] 06/07/2020 12:07 PM

## 2020-06-07 NOTE — Discharge Summary (Signed)
Postpartum Discharge Summary    Patient Name: Theresa Fletcher DOB: 1996-06-19 MRN: [id number]  Date of admission: 06/07/2020 Delivery date:06/07/2020  Delivering provider: Ezequiel Essex  Date of discharge: 06/08/2020  Admitting diagnosis: Labor and delivery indication for care or intervention [O75.9] Intrauterine pregnancy: [redacted]w[redacted]d    Secondary diagnosis:  Active Problems:   Labor and delivery indication for care or intervention  Additional problems: n/a    Discharge diagnosis: Term Pregnancy Delivered                                              Post partum procedures:postpartum tubal ligation Augmentation: AROM Complications: None  Hospital course: Onset of Labor With Vaginal Delivery      24y.o. yo G3P1011 at 328w3das admitted in Active Labor on 06/07/2020. Patient had an uncomplicated labor course as follows:  Membrane Rupture Time/Date: 1:54 PM ,06/07/2020   Delivery Method:Vaginal, Spontaneous  Episiotomy: None  Lacerations:  None  Patient had an uncomplicated postpartum course.  She is ambulating, tolerating a regular diet, passing flatus, and urinating well. Patient is discharged home in stable condition on 06/08/20.  Newborn Data: Birth date:06/07/2020  Birth time:2:04 PM  Gender:Female  Living status:Living  Apgars:9 ,9  Weight:2945 g   Magnesium Sulfate received: No BMZ received: No Rhophylac:Yes  MMR:No T-DaP:Given prenatally Flu: No Transfusion:No  Physical exam  Vitals:   06/07/20 1745 06/07/20 1804 06/07/20 1912 06/08/20 0536  BP: (!) 105/57 118/84 114/75 105/62  Pulse: 90 94 72 88  Resp: _0 Temp:  98.2 F (36.8 C) 98.3 F (36.8 C) 98.2 F (36.8 C)  TempSrc:   Oral Oral  SpO2: 99% 99%  100%   General: alert, cooperative and no distress Lochia: appropriate Uterine Fundus: firm Incision: N/A DVT Evaluation: No evidence of DVT seen on physical exam. Labs: Lab Results  Component Value Date   WBC 10.5 06/07/2020   HGB 10.6 (L)  06/07/2020   HCT 32.7 (L) 06/07/2020   MCV 91.6 06/07/2020   PLT 223 06/07/2020   CMP Latest Ref Rng & Units 05/06/2019  Glucose 65 - 99 mg/dL 81  BUN 6 - 20 mg/dL 6  Creatinine 0.57 - 1.00 mg/dL 0.52(L)  Sodium 134 - 144 mmol/L 135  Potassium 3.5 - 5.2 mmol/L 4.0  Chloride 96 - 106 mmol/L 101  CO2 20 - 29 mmol/L 22  Calcium 8.7 - 10.2 mg/dL 8.8  Total Protein 6.0 - 8.5 g/dL 6.7  Total Bilirubin 0.0 - 1.2 mg/dL 0.2  Alkaline Phos 39 - 117 IU/L 40  AST 0 - 40 IU/L 12  ALT 0 - 32 IU/L 12   Edinburgh Score: No flowsheet data found.   After visit meds:  Allergies as of 06/08/2020   No Known Allergies     Medication List    TAKE these medications   acetaminophen 500 MG tablet Commonly known as: TYLENOL Take 1,000 mg by mouth every 6 (six) hours as needed for mild pain or headache.   Blood Pressure Kit 1 Device by Does not apply route once a week. To be monitored at home weekly   ibuprofen 600 MG tablet Commonly known as: ADVIL Take 1 tablet (600 mg total) by mouth every 6 (six) hours.   oxyCODONE-acetaminophen 5-325 MG tablet Commonly known as: PERCOCET/ROXICET Take 1 tablet by mouth every 4 (  four) hours as needed (pain scale 4-7).   prenatal multivitamin Tabs tablet Take 1 tablet by mouth daily at 12 noon.        Discharge home in stable condition Infant Feeding: No evidence of DVT seen on physical exam. Infant Disposition:home with mother Discharge instruction: per After Visit Summary and Postpartum booklet. Activity: Advance as tolerated. Pelvic rest for 6 weeks.  Diet: routine diet Future Appointments: Future Appointments  Date Time Provider [place]  06/11/2020 10:45 AM Aletha Halim, MD [facility]   Follow up Visit:  [place] [facility] [place] [facility] Follow up.   Specialty: Obstetrics and Gynecology Why: in 1 week for an incision check and 4 weeks for a postpartum appointment Contact  information: [place] [phone]             Message sent by S. Jeronimo Greaves, CNM on 06/07/2020  Please schedule this patient for a Virtual postpartum visit in 4 weeks with the following provider: Any provider. Additional Postpartum F/U:N/A  High risk pregnancy complicated by: hx cervical insufficiency, preterm labor, cerclage Delivery mode:  Vaginal, Spontaneous  Anticipated Birth Control:  BTL done PP   06/08/2020 Wende Mott, CNM

## 2020-06-07 NOTE — Anesthesia Preprocedure Evaluation (Signed)
Anesthesia Evaluation  Patient identified by MRN, date of birth, ID band Patient awake    Reviewed: Allergy & Precautions, NPO status , Patient's Chart, lab work & pertinent test results  Airway Mallampati: II  TM Distance: >3 FB Neck ROM: Full    Dental no notable dental hx. (+) Teeth Intact, Dental Advisory Given   Pulmonary neg pulmonary ROS,    Pulmonary exam normal breath sounds clear to auscultation       Cardiovascular negative cardio ROS Normal cardiovascular exam Rhythm:Regular Rate:Normal     Neuro/Psych negative neurological ROS  negative psych ROS   GI/Hepatic negative GI ROS, Neg liver ROS,   Endo/Other  negative endocrine ROS  Renal/GU negative Renal ROS  negative genitourinary   Musculoskeletal negative musculoskeletal ROS (+)   Abdominal (+) + obese,   Peds  Hematology negative hematology ROS (+)   Anesthesia Other Findings   Reproductive/Obstetrics                             Anesthesia Physical  Anesthesia Plan  ASA: II  Anesthesia Plan: Epidural   Post-op Pain Management:    Induction:   PONV Risk Score and Plan: 2 and Ondansetron, Midazolam and Treatment may vary due to age or medical condition  Airway Management Planned: Natural Airway  Additional Equipment:   Intra-op Plan:   Post-operative Plan:   Informed Consent: I have reviewed the patients History and Physical, chart, labs and discussed the procedure including the risks, benefits and alternatives for the proposed anesthesia with the patient or authorized representative who has indicated his/her understanding and acceptance.     Dental advisory given  Plan Discussed with: CRNA  Anesthesia Plan Comments:         Anesthesia Quick Evaluation

## 2020-06-07 NOTE — Procedures (Deleted)
Theresa Fletcher is a 24 y.o. [id number] s/p vaginal delivery at [redacted]w[redacted]d.  She was admitted for labor in the setting of spontaneous ROM.    ROM: 0h 85m with clear fluid GBS Status: Positive Maximum Maternal Temperature: 98.2*F   Labor Progress: Pt in latent labor on admission.  She continued to progress well. After AROM, she progressed rapidly and delivered without complication as noted below.   Delivery Date/Time: 06/07/2020 @ 1404 Delivery: Called to room and artificially ruptured membranes at 1354. Within 10 minutes, head delivered and nuchal cord present x1. Nuchal cord easily reduced. Shoulder and body then delivered in usual fashion. Infant with spontaneous cry, placed on mother's abdomen, dried and stimulated. Cord clamped x 2 after 1-minute delay, and cut by FOB under my direct supervision. Cord blood drawn. Placenta delivered spontaneously with gentle cord traction. Fundus firm with massage. Labia, perineum, vagina, and cervix were inspected; no lacerations visualized.    Placenta: intact, 3-vessel cord, sent to L&D Complications: none Lacerations: none EBL: 50 ml Analgesia: epidural   Infant: Female  APGARs 9 & 9  weight per medical record  Fayette Pho, MD

## 2020-06-07 NOTE — Progress Notes (Signed)
Report rec'd from charge RN. Care assumed by this RN at this time.

## 2020-06-07 NOTE — Anesthesia Postprocedure Evaluation (Signed)
Anesthesia Post Note  Patient: Theresa Fletcher  Procedure(s) Performed: POST PARTUM TUBAL LIGATION (N/A )     Patient location during evaluation: PACU Anesthesia Type: Epidural Level of consciousness: awake and alert Pain management: pain level controlled Vital Signs Assessment: post-procedure vital signs reviewed and stable Respiratory status: spontaneous breathing, nonlabored ventilation and respiratory function stable Cardiovascular status: blood pressure returned to baseline and stable Postop Assessment: no apparent nausea or vomiting Anesthetic complications: no   No complications documented.  Last Vitals:  Vitals:   06/07/20 1645 06/07/20 1700  BP: 112/71 110/77  Pulse: 82 82  Resp: (!) 22 18  Temp:    SpO2: 100% 99%    Last Pain:  Vitals:   06/07/20 1700  TempSrc:   PainSc: Asleep   Pain Goal:    LLE Motor Response: Non-purposeful movement (06/07/20 1700)   RLE Motor Response: Non-purposeful movement (06/07/20 1700)       Epidural/Spinal Function Cutaneous sensation: Tingles (06/07/20 1700), Patient able to flex knees: Yes (06/07/20 1700), Patient able to lift hips off bed: No (06/07/20 1700), Back pain beyond tenderness at insertion site: No (06/07/20 1700), Progressively worsening motor and/or sensory loss: No (06/07/20 1700), Bowel and/or bladder incontinence post epidural: No (06/07/20 1700)  Lowella Curb

## 2020-06-07 NOTE — Anesthesia Preprocedure Evaluation (Signed)
Anesthesia Evaluation  Patient identified by MRN, date of birth, ID band Patient awake    Reviewed: Allergy & Precautions, NPO status , Patient's Chart, lab work & pertinent test results  Airway Mallampati: II  TM Distance: >3 FB Neck ROM: Full    Dental no notable dental hx. (+) Teeth Intact, Dental Advisory Given   Pulmonary neg pulmonary ROS,    Pulmonary exam normal breath sounds clear to auscultation       Cardiovascular negative cardio ROS Normal cardiovascular exam Rhythm:Regular Rate:Normal     Neuro/Psych negative neurological ROS  negative psych ROS   GI/Hepatic negative GI ROS, Neg liver ROS,   Endo/Other  negative endocrine ROS  Renal/GU negative Renal ROS  negative genitourinary   Musculoskeletal negative musculoskeletal ROS (+)   Abdominal (+) + obese,   Peds  Hematology negative hematology ROS (+)   Anesthesia Other Findings   Reproductive/Obstetrics (+) Pregnancy                             Anesthesia Physical  Anesthesia Plan  ASA: II  Anesthesia Plan: Epidural   Post-op Pain Management:    Induction:   PONV Risk Score and Plan:   Airway Management Planned: Natural Airway  Additional Equipment:   Intra-op Plan:   Post-operative Plan:   Informed Consent: I have reviewed the patients History and Physical, chart, labs and discussed the procedure including the risks, benefits and alternatives for the proposed anesthesia with the patient or authorized representative who has indicated his/her understanding and acceptance.       Plan Discussed with:   Anesthesia Plan Comments:         Anesthesia Quick Evaluation

## 2020-06-07 NOTE — Transfer of Care (Signed)
Immediate Anesthesia Transfer of Care Note  Patient: Theresa Fletcher  Procedure(s) Performed: POST PARTUM TUBAL LIGATION (N/A )  Patient Location: PACU  Anesthesia Type:Epidural  Level of Consciousness: awake and alert   Airway & Oxygen Therapy: Patient Spontanous Breathing  Post-op Assessment: Report given to RN and Post -op Vital signs reviewed and stable  Post vital signs: Reviewed  Last Vitals:  Vitals Value Taken Time  BP 112/71 06/07/20 1645  Temp 36.5 C 06/07/20 1642  Pulse 79 06/07/20 1646  Resp 13 06/07/20 1646  SpO2 100 % 06/07/20 1646  Vitals shown include unvalidated device data.  Last Pain:  Vitals:   06/07/20 1642  TempSrc: Oral  PainSc:          Complications: No complications documented.

## 2020-06-07 NOTE — Op Note (Signed)
Quierra Shirley Friar Robb Matar 06/07/2020  PREOPERATIVE DIAGNOSES: Multiparity, undesired fertility  POSTOPERATIVE DIAGNOSES: Multiparity, undesired fertility  PROCEDURE:  Postpartum Bilateral Tubal Ligation   SURGEON: Dr.  Raynelle Dick  ANESTHESIA:  Epidural and local analgesia using 30 ml of 0.5% Marcaine  COMPLICATIONS:  None immediate.  ESTIMATED BLOOD LOSS: 10 ml.  INDICATIONS:  24 y.o. [id number] with undesired fertility, status post vaginal delivery, desires permanent sterilization.  Other reversible forms of contraception were discussed with patient; she declines all other modalities. Risks of procedure discussed with patient including but not limited to: risk of regret, permanence of method, bleeding, infection, injury to surrounding organs and need for additional procedures.  Failure risk of 1-2 % with increased risk of ectopic gestation if pregnancy occurs and possibility of post-tubal pain syndrome also discussed with patient.     FINDINGS:  Normal uterus, tubes, and ovaries.  PROCEDURE DETAILS: The patient was taken to the operating room where her epidural anesthesia was dosed up to surgical level and found to be adequate.  She was then placed in the dorsal supine position and prepped and draped in sterile fashion.  After an adequate timeout was performed, attention was turned to the patient's abdomen where a small transverse skin incision was made under the umbilical fold. The incision was taken down to the layer of fascia using the scalpel, and fascia was incised, and extended bilaterally using Mayo scissors. The peritoneum was entered in a sharp fashion. Attention was then turned to the patient's uterus, and left fallopian tube was identified and followed out to the fimbriated end.  Approximately 3 cm of left fallopian tube was resected by modified Pomeroy technique, with care given to incorporate the underlying mesosalpinx.  A similar process was carried out on the right side allowing for  bilateral tubal sterilization.  Good hemostasis was noted overall.  The instruments were then removed from the patient's abdomen and the fascial incision was repaired with 0 Vicryl, and the skin was closed with a 4-0 Vicryl subcuticular stitch. The patient tolerated the procedure well.  Instrument, sponge, and needle counts were correct times three.  The patient was then taken to the recovery room awake and in stable condition.  Raynelle Dick, MD, FACOG Obstetrician & Gynecologist, [facility] for [facility], [facility]

## 2020-06-07 NOTE — Lactation Note (Signed)
This note was copied from a baby's chart. Lactation Consultation Note  Patient Name: Boy Tanecia Casimiro Needle ENIDP'O Date: 06/07/2020 Reason for consult: Initial assessment;Early term 37-38.6wks P2, 8 hour ETI female infant. Per mom, after delivery she had tubal so there was a delay in breastfeeding infant.  Per mom, this is infant's 2nd time latching at the breast. Infant BF in MBU first time for 20 minutes. Infant had one void and one stool since birth. Mom is experienced at BF, she BF her 39 year old child for 4 months. She doesn't have breast pump at home, [facility] gave mom hand pump and explained how to use. Mom latched infant on her left breast using the football hold position, infant latched with depth, was still BF after LC left room for 5 minutes, RN in room with parents. Mom knows to BF infant according to cues, 8 to 12+ times within 24 hours, STS. Mom knows to call RN or LC if she has any questions, concerns or needs assistance with latching infant at the breast.  Mom made aware of O/P services, breastfeeding support groups, community resources, and our phone # for post-discharge questions.   Maternal Data Formula Feeding for Exclusion: Yes Reason for exclusion: Mother's choice to formula and breast feed on admission Has patient been taught Hand Expression?: Yes Does the patient have breastfeeding experience prior to this delivery?: Yes  Feeding Feeding Type: Breast Fed  LATCH Score Latch: Grasps breast easily, tongue down, lips flanged, rhythmical sucking.  Audible Swallowing: Spontaneous and intermittent  Type of Nipple: Everted at rest and after stimulation  Comfort (Breast/Nipple): Soft / non-tender  Hold (Positioning): Assistance needed to correctly position infant at breast and maintain latch.  LATCH Score: 9  Interventions Interventions: Breast feeding basics reviewed;Assisted with latch;Breast compression;Skin to skin;Adjust position;Breast massage;Support  pillows;Hand express;Position options;Expressed milk;Hand pump  Lactation Tools Discussed/Used     Consult Status Consult Status: Follow-up Date: 06/08/20 Follow-up type: In-patient    Danelle Earthly 06/07/2020, 11:01 PM

## 2020-06-07 NOTE — Anesthesia Procedure Notes (Signed)
Epidural Patient location during procedure: OB Start time: 06/07/2020 10:10 AM End time: 06/07/2020 10:27 AM  Staffing Anesthesiologist: Lowella Curb, MD Performed: anesthesiologist   Preanesthetic Checklist Completed: patient identified, IV checked, site marked, risks and benefits discussed, surgical consent, monitors and equipment checked, pre-op evaluation and timeout performed  Epidural Patient position: sitting Prep: ChloraPrep Patient monitoring: heart rate, cardiac monitor, continuous pulse ox and blood pressure Approach: midline Location: L2-L3 Injection technique: LOR saline  Needle:  Needle type: Tuohy  Needle gauge: 17 G Needle length: 9 cm Needle insertion depth: 7 cm Catheter type: closed end flexible Catheter size: 20 Guage Catheter at skin depth: 11 cm Test dose: negative  Assessment Events: blood not aspirated, injection not painful, no injection resistance, no paresthesia and negative IV test  Additional Notes Reason for block:procedure for pain

## 2020-06-07 NOTE — MAU Note (Signed)
Theresa Fletcher is a 24 y.o. at [redacted]w[redacted]d here in MAU reporting: contractions, reports some bloody show, +FM, denies LOF onset of complaint:0350 Pain score: 10

## 2020-06-08 MED ORDER — RHO D IMMUNE GLOBULIN 1500 UNIT/2ML IJ SOSY
300.0000 ug | PREFILLED_SYRINGE | Freq: Once | INTRAMUSCULAR | Status: AC
  Administered 2020-06-09: 300 ug via INTRAMUSCULAR
  Filled 2020-06-08: qty 2

## 2020-06-08 MED ORDER — OXYCODONE-ACETAMINOPHEN 5-325 MG PO TABS
1.0000 | ORAL_TABLET | ORAL | Status: DC | PRN
  Administered 2020-06-08: 1 via ORAL
  Filled 2020-06-08: qty 1

## 2020-06-08 MED ORDER — IBUPROFEN 600 MG PO TABS: 600.0000 mg | ORAL_TABLET | Freq: Four times a day (QID) | ORAL | 0 refills | Status: DC

## 2020-06-08 MED ORDER — GELATIN ABSORBABLE 12-7 MM EX MISC
CUTANEOUS | Status: AC
  Filled 2020-06-08: qty 1

## 2020-06-08 MED ORDER — OXYCODONE-ACETAMINOPHEN 5-325 MG PO TABS
1.0000 | ORAL_TABLET | ORAL | 0 refills | Status: DC | PRN
Start: 2020-06-08 — End: 2021-07-08

## 2020-06-08 MED ORDER — OXYCODONE-ACETAMINOPHEN 5-325 MG PO TABS
2.0000 | ORAL_TABLET | ORAL | Status: DC | PRN
  Administered 2020-06-08 (×2): 2 via ORAL
  Filled 2020-06-08 (×2): qty 2

## 2020-06-08 NOTE — Lactation Note (Signed)
This note was copied from a baby's chart. Lactation Consultation Note  Patient Name: Mia Creek Chicas Today's Date: 06/08/2020  P2, 32 hour ETI female infant. Per mom, BF is going well, infant  BF for 20 to 30 minutes most feedings. LC did not observe latch at this time, mom recently finished feeding infant prior to [facility] entering the room. Per mom, infant had total of 7 stools today 4 of which occurred after his circumcision and 3 voids. LC briefly discussed infant's high billi at risk for possible jaundice, LC asked if their was family hx. Mom stated not on her family side,  dad called his mother [facility]) which confirmed dad  had jaundice as infant and stay longer in hospital.   Mom will continue to BF infant according to cues, 8 to 12+ time, STS,  and can hand express after latching infant at breast and give back extra volume. Mom know to call [facility] services if she has questions or concerns.    Maternal Data    Feeding    LATCH Score                   Interventions    Lactation Tools Discussed/Used     Consult Status      Danelle Earthly 06/08/2020, 10:43 PM

## 2020-06-08 NOTE — Anesthesia Postprocedure Evaluation (Signed)
Anesthesia Post Note  Patient: Theresa Fletcher  Procedure(s) Performed: AN AD HOC LABOR EPIDURAL     Patient location during evaluation: Mother Baby Anesthesia Type: Epidural Level of consciousness: awake and alert Pain management: pain level controlled Vital Signs Assessment: post-procedure vital signs reviewed and stable Respiratory status: spontaneous breathing, nonlabored ventilation and respiratory function stable Cardiovascular status: stable Postop Assessment: no headache, no backache, epidural receding, no apparent nausea or vomiting, adequate PO intake and able to ambulate Anesthetic complications: no   No complications documented.  Last Vitals:  Vitals:   06/07/20 1912 06/08/20 0536  BP: 114/75 105/62  Pulse: 72 88  Resp: 18 16  Temp: 36.8 C 36.8 C  SpO2:  100%    Last Pain:  Vitals:   06/08/20 1059  TempSrc:   PainSc: 5    Pain Goal:                   Blythe Stanford

## 2020-06-09 LAB — SURGICAL PATHOLOGY

## 2020-06-09 NOTE — Lactation Note (Signed)
This note was copied from a baby's chart. Lactation Consultation Note  Patient Name: Theresa Fletcher Chicas [id number] Date: 06/09/2020 Reason for consult: Follow-up assessment   Baby 42 hours old.  [redacted]w[redacted]d.  Mother has chosen to breastfeed and formula feed.  7.7% weight loss. 3 voids and 4 stools in the last 24 hours.  Baby had a sleepy period yesterday after circumcision where he did not feed. Encouraged mother to breastfeed before offering formula to help establish her milk supply. Feed on demand with cues.  Goal 8-12+ times per day after first 24 hrs.  Place baby STS if not cueing.  Reviewed engorgement care and monitoring voids/stools.    Maternal Data    Feeding Feeding Type: Breast Fed  LATCH Score                   Interventions Interventions: Breast feeding basics reviewed  Lactation Tools Discussed/Used     Consult Status Consult Status: Complete Date: 06/09/20    Dahlia Byes [facility] 06/09/2020, 8:55 AM

## 2020-06-09 NOTE — Discharge Summary (Signed)
Postpartum Discharge Summary     Patient Name: Theresa Fletcher DOB: April 02, 1996 MRN: [id number]  Date of admission: 06/07/2020 Delivery date:06/07/2020  Delivering provider: Ezequiel Essex  Date of discharge: 06/09/2020  Admitting diagnosis: Labor and delivery indication for care or intervention [O75.9] Intrauterine pregnancy: [redacted]w[redacted]d    Secondary diagnosis:  Active Problems:   Rh negative state in antepartum period   Cervical insufficiency during pregnancy, antepartum   Request for sterilization   Group B Streptococcus carrier, +RV culture, currently pregnant   Labor and delivery indication for care or intervention  Additional problems: none    Discharge diagnosis: Term Pregnancy Delivered                                              Post partum procedures:postpartum tubal ligation and rhogam Augmentation: AROM Complications: None  Hospital course: Onset of Labor With Vaginal Delivery      24y.o. yo [id number] admitted in Active Labor on 06/07/2020. Patient had an uncomplicated labor course as follows:  Membrane Rupture Time/Date: 1:54 PM ,06/07/2020   Delivery Method:Vaginal, Spontaneous  Episiotomy: None  Lacerations:  None  Patient had an uncomplicated postpartum course. On PPD#1 she had a ppBTL per her request- tolerated well. She is ambulating, tolerating a regular diet, passing flatus, and urinating well. Patient is discharged home in stable condition on 06/09/20.  Newborn Data: Birth date:06/07/2020  Birth time:2:04 PM  Gender:Female  Living status:Living  Apgars:9 ,9  Weight:2945 g  (6lb 7.9oz)  Magnesium Sulfate received: No BMZ received: No Rhophylac:Yes MMR:No T-DaP:Given prenatally Flu: No Transfusion:No  Physical exam  Vitals:   06/07/20 1804 06/07/20 1912 06/08/20 0536 06/08/20 2059  BP: 118/84 114/75 105/62 114/76  Pulse: 94 72 88 93  Resp: _0 Temp: 98.2 F (36.8 C) 98.3 F (36.8 C) 98.2 F (36.8 C) 98 F (36.7 C)   TempSrc:  Oral Oral Oral  SpO2: 99%  100% 99%   General: alert and cooperative Lochia: appropriate Uterine Fundus: firm Incision: honeycomb dsg on umbilical incision clean & dry DVT Evaluation: No evidence of DVT seen on physical exam. Labs: Lab Results  Component Value Date   WBC 10.5 06/07/2020   HGB 10.6 (L) 06/07/2020   HCT 32.7 (L) 06/07/2020   MCV 91.6 06/07/2020   PLT 223 06/07/2020   CMP Latest Ref Rng & Units 05/06/2019  Glucose 65 - 99 mg/dL 81  BUN 6 - 20 mg/dL 6  Creatinine 0.57 - 1.00 mg/dL 0.52(L)  Sodium 134 - 144 mmol/L 135  Potassium 3.5 - 5.2 mmol/L 4.0  Chloride 96 - 106 mmol/L 101  CO2 20 - 29 mmol/L 22  Calcium 8.7 - 10.2 mg/dL 8.8  Total Protein 6.0 - 8.5 g/dL 6.7  Total Bilirubin 0.0 - 1.2 mg/dL 0.2  Alkaline Phos 39 - 117 IU/L 40  AST 0 - 40 IU/L 12  ALT 0 - 32 IU/L 12   Edinburgh Score: Edinburgh Postnatal Depression Scale Screening Tool 06/08/2020  I have been able to laugh and see the funny side of things. 0  I have looked forward with enjoyment to things. 0  I have blamed myself unnecessarily when things went wrong. 1  I have been anxious or worried for no good reason. 1  I have felt scared or panicky for no good reason.  0  Things have been getting on top of me. 0  I have been so unhappy that I have had difficulty sleeping. 0  I have felt sad or miserable. 0  I have been so unhappy that I have been crying. 0  The thought of harming myself has occurred to me. 0  Edinburgh Postnatal Depression Scale Total 2     After visit meds:  Allergies as of 06/09/2020   No Known Allergies     Medication List    TAKE these medications   acetaminophen 500 MG tablet Commonly known as: TYLENOL Take 1,000 mg by mouth every 6 (six) hours as needed for mild pain or headache.   Blood Pressure Kit 1 Device by Does not apply route once a week. To be monitored at home weekly   ibuprofen 600 MG tablet Commonly known as: ADVIL Take 1 tablet (600 mg  total) by mouth every 6 (six) hours.   oxyCODONE-acetaminophen 5-325 MG tablet Commonly known as: PERCOCET/ROXICET Take 1 tablet by mouth every 4 (four) hours as needed (pain scale 4-7).   prenatal multivitamin Tabs tablet Take 1 tablet by mouth daily at 12 noon.        Discharge home in stable condition Infant Feeding: Breast Infant Disposition:home with mother Discharge instruction: per After Visit Summary and Postpartum booklet. Activity: Advance as tolerated. Pelvic rest for 6 weeks.  Diet: routine diet Future Appointments: Future Appointments  Date Time Provider [place]  07/13/2020  1:15 PM Anyanwu, Sallyanne Havers, MD [facility]   Follow up Visit:  Message sent by S. Jeronimo Greaves, CNM on 06/07/2020  Please schedule this patient for a Virtual postpartum visit in 4 weeks with the following provider: Any provider. Additional Postpartum F/U:N/A  High risk pregnancy complicated by: hx cervical insufficiency, preterm labor, cerclage Delivery mode: Vaginal, Spontaneous  Anticipated Birth Control: BTL done PP  06/09/2020 Myrtis Ser, CNM  10:10 AM

## 2020-06-09 NOTE — Discharge Instructions (Signed)
Laparoscopic Tubal Ligation, Care After This sheet gives you information about how to care for yourself after your procedure. Your health care provider may also give you more specific instructions. If you have problems or questions, contact your health care provider. What can I expect after the procedure? After the procedure, it is common to have:  A sore throat.  Discomfort in your shoulder.  Mild discomfort or cramping in your abdomen.  Gas pains.  Pain or soreness in the area where the surgical incision was made.  A bloated feeling.  Tiredness.  Nausea.  Vomiting. Follow these instructions at home: Medicines  Take over-the-counter and prescription medicines only as told by your health care provider.  Do not take aspirin because it can cause bleeding.  Ask your health care provider if the medicine prescribed to you: ? Requires you to avoid driving or using heavy machinery. ? Can cause constipation. You may need to take actions to prevent or treat constipation, such as:  Drink enough fluid to keep your urine pale yellow.  Take over-the-counter or prescription medicines.  Eat foods that are high in fiber, such as beans, whole grains, and fresh fruits and vegetables.  Limit foods that are high in fat and processed sugars, such as fried or sweet foods. Incision care      Follow instructions from your health care provider about how to take care of your incision. Make sure you: ? Wash your hands with soap and water before and after you change your bandage (dressing). If soap and water are not available, use hand sanitizer. ? Change your dressing as told by your health care provider. ? Leave stitches (sutures), skin glue, or adhesive strips in place. These skin closures may need to stay in place for 2 weeks or longer. If adhesive strip edges start to loosen and curl up, you may trim the loose edges. Do not remove adhesive strips completely unless your health care provider  tells you to do that.  Check your incision area every day for signs of infection. Check for: ? Redness, swelling, or pain. ? Fluid or blood. ? Warmth. ? Pus or a bad smell. Activity  Rest as told by your health care provider.  Avoid sitting for a long time without moving. Get up to take short walks every 1-2 hours. This is important to improve blood flow and breathing. Ask for help if you feel weak or unsteady.  Return to your normal activities as told by your health care provider. Ask your health care provider what activities are safe for you. General instructions  Do not take baths, swim, or use a hot tub until your health care provider approves. Ask your health care provider if you may take showers. You may only be allowed to take sponge baths.  Have someone help you with your daily household tasks for the first few days.  Keep all follow-up visits as told by your health care provider. This is important. Contact a health care provider if:  You have redness, swelling, or pain around your incision.  Your incision feels warm to the touch.  You have pus or a bad smell coming from your incision.  The edges of your incision break open after the sutures have been removed.  Your pain does not improve after 2-3 days.  You have a rash.  You repeatedly become dizzy or light-headed.  Your pain medicine is not helping. Get help right away if you:  Have a fever.  Faint.  Have increasing   pain in your abdomen.  Have severe pain in one or both of your shoulders.  Have fluid or blood coming from your sutures or from your vagina.  Have shortness of breath or difficulty breathing.  Have chest pain or leg pain.  Have ongoing nausea, vomiting, or diarrhea. Summary  After the procedure, it is common to have mild discomfort or cramping in your abdomen.  Take over-the-counter and prescription medicines only as told by your health care provider.  Watch for symptoms that should  prompt you to call your health care provider.  Keep all follow-up visits as told by your health care provider. This is important. This information is not intended to replace advice given to you by your health care provider. Make sure you discuss any questions you have with your health care provider. Document Revised: 01/29/2019 Document Reviewed: 07/17/2018 Elsevier Patient Education  2020 [employer]. Vaginal Delivery Care After Refer to this sheet in the next few weeks. These discharge instructions provide you with information on caring for yourself after delivery. Your caregiver may also give you specific instructions. Your treatment has been planned according to the most current medical practices available, but problems sometimes occur. Call your caregiver if you have any problems or questions after you go home. HOME CARE INSTRUCTIONS  Take over-the-counter or prescription medicines only as directed by your caregiver or pharmacist.  Do not drink alcohol, especially if you are breastfeeding or taking medicine to relieve pain.  Do not chew or smoke tobacco.  Do not use illegal drugs.  Continue to use good perineal care. Good perineal care includes:  Wiping your perineum from front to back.  Keeping your perineum clean.  Do not use tampons or douche until your caregiver says it is okay.  Shower, wash your hair, and take tub baths as directed by your caregiver.  Wear a well-fitting bra that provides breast support.  Eat healthy foods.  Drink enough fluids to keep your urine clear or pale yellow.  Eat high-fiber foods such as whole grain cereals and breads, brown rice, beans, and fresh fruits and vegetables every day. These foods may help prevent or relieve constipation.  Follow your cargiver's recommendations regarding resumption of activities such as climbing stairs, driving, lifting, exercising, or traveling.  Talk to your caregiver about resuming sexual activities.  Resumption of sexual activities is dependent upon your risk of infection, your rate of healing, and your comfort and desire to resume sexual activity.  Try to have someone help you with your household activities and your newborn for at least a few days after you leave the hospital.  Rest as much as possible. Try to rest or take a nap when your newborn is sleeping.  Increase your activities gradually.  Keep all of your scheduled postpartum appointments. It is very important to keep your scheduled follow-up appointments. At these appointments, your caregiver will be checking to make sure that you are healing physically and emotionally. SEEK MEDICAL CARE IF:   You are passing large clots from your vagina. Save any clots to show your caregiver.  You have a foul smelling discharge from your vagina.  You have trouble urinating.  You are urinating frequently.  You have pain when you urinate.  You have a change in your bowel movements.  You have increasing redness, pain, or swelling near your vaginal incision (episiotomy) or vaginal tear.  You have pus draining from your episiotomy or vaginal tear.  Your episiotomy or vaginal tear is separating.  You  have painful, hard, or reddened breasts.  You have a severe headache.  You have blurred vision or see spots.  You feel sad or depressed.  You have thoughts of hurting yourself or your newborn.  You have questions about your care, the care of your newborn, or medicines.  You are dizzy or lightheaded.  You have a rash.  You have nausea or vomiting.  You were breastfeeding and have not had a menstrual period within 12 weeks after you stopped breastfeeding.  You are not breastfeeding and have not had a menstrual period by the 12th week after delivery.  You have a fever. SEEK IMMEDIATE MEDICAL CARE IF:   You have persistent pain.  You have chest pain.  You have shortness of breath.  You faint.  You have leg pain.  You  have stomach pain.  Your vaginal bleeding saturates two or more sanitary pads in 1 hour. MAKE SURE YOU:   Understand these instructions.  Will watch your condition.  Will get help right away if you are not doing well or get worse. Document Released: 08/19/2000 Document Revised: 05/16/2012 Document Reviewed: 04/18/2012 [facility] Patient Information 2014 [place], [place].

## 2020-06-10 LAB — RH IG WORKUP (INCLUDES ABO/RH)
ABO/RH(D): O NEG
Fetal Screen: NEGATIVE
Gestational Age(Wks): 37
Unit division: 0

## 2020-06-11 ENCOUNTER — Encounter: Payer: Medicaid Other | Admitting: Obstetrics and Gynecology

## 2020-07-13 ENCOUNTER — Ambulatory Visit (INDEPENDENT_AMBULATORY_CARE_PROVIDER_SITE_OTHER): Payer: Medicaid Other | Admitting: Obstetrics & Gynecology

## 2020-07-13 ENCOUNTER — Encounter: Payer: Self-pay | Admitting: Obstetrics & Gynecology

## 2020-07-13 ENCOUNTER — Other Ambulatory Visit: Payer: Self-pay

## 2020-07-13 DIAGNOSIS — Z9851 Tubal ligation status: Secondary | ICD-10-CM | POA: Diagnosis not present

## 2020-07-13 MED ORDER — IBUPROFEN 600 MG PO TABS: 600.0000 mg | ORAL_TABLET | Freq: Four times a day (QID) | ORAL | 0 refills | Status: DC

## 2020-07-13 NOTE — Progress Notes (Signed)
    Post Partum Visit Note  Anaih Madiha Bambrick is a 24 y.o. [phone] female who presents for a postpartum visit. She is 5 weeks postpartum following a normal spontaneous vaginal delivery.  I have fully reviewed the prenatal and intrapartum course. The delivery was at 37.3 gestational weeks.  Anesthesia: epidural. Postpartum course has been uncomplicated. Baby is doing well. Baby is feeding by bottle - Enfamil soothing. Bleeding: no bleeding. Bowel function is normal. Bladder function is normal. Patient is not sexually active. Contraception method is tubal ligation. Postpartum depression screening: negative.  The pregnancy intention screening data noted above was reviewed.    Edinburgh Postnatal Depression Scale - 07/13/20 1319      Edinburgh Postnatal Depression Scale:  In the Past 7 Days   I have been able to laugh and see the funny side of things. 0    I have looked forward with enjoyment to things. 0    I have blamed myself unnecessarily when things went wrong. 0    I have been anxious or worried for no good reason. 0    I have felt scared or panicky for no good reason. 0    Things have been getting on top of me. 1    I have been so unhappy that I have had difficulty sleeping. 0    I have felt sad or miserable. 0    I have been so unhappy that I have been crying. 0    The thought of harming myself has occurred to me. 0    Edinburgh Postnatal Depression Scale Total 1          The following portions of the patient's history were reviewed and updated as appropriate: allergies, current medications, past family history, past medical history, past social history, past surgical history and problem list.  Review of Systems Pertinent items noted in HPI and remainder of comprehensive ROS otherwise negative.    Objective:  Blood pressure 108/72, pulse 80, weight 171 lb (77.6 kg), last menstrual period 09/19/2019, not currently breastfeeding.  General:  alert and no distress    Breasts:  deferred  Lungs: clear to auscultation bilaterally  Heart:  regular rate and rhythm  Abdomen: soft, non-tender; bowel sounds normal; no masses,  no organomegaly. Incision is well-healed.         Assessment:   Normal postpartum exam. Pap smear not done at today's visit.   Plan:   Essential components of care per ACOG recommendations:  1.  Mood and well being: Patient with negative depression screening today. Reviewed local resources for support.  - Patient does not use tobacco or drugs.   2. Infant care and feeding:  -Patient currently breastmilk feeding? No -Social determinants of health (SDOH) reviewed in EPIC. No concerns  3. Sexuality, contraception and birth spacing - Patient does not want a pregnancy in the next year.  Desired family size is 2 children.  Had a postpartum tubal ligation.   4. Sleep and fatigue -Encouraged family/partner/community support of 4 hrs of uninterrupted sleep to help with mood and fatigue  5. Physical Recovery  - Discussed patients delivery - Patient no laceration. - Patient has urinary incontinence? No  - Patient is safe to resume physical and sexual activity  6.  Health Maintenance - Last pap smear done 11/28/19 and was normal with negative HPV.    Jaynie Collins, MD [facility] [employer], [facility]

## 2020-08-17 ENCOUNTER — Encounter: Payer: Self-pay | Admitting: General Practice

## 2020-09-14 ENCOUNTER — Telehealth: Payer: Self-pay | Admitting: *Deleted

## 2020-09-14 MED ORDER — TERCONAZOLE 0.8 % VA CREA: 1.0000 | TOPICAL_CREAM | Freq: Every day | VAGINAL | 0 refills | Status: DC

## 2020-09-14 NOTE — Telephone Encounter (Signed)
Pt called requesting terazol cream for a yeast infection. Will send in, but if not better pt will need to come in for swab. Pt verbalizes and understands.

## 2020-11-03 IMAGING — US TRANSVAGINAL OB ULTRASOUND
1 series · 15 of 20 positions shown · non-contrast
Comparison: None.

CLINICAL DATA: 23-year-old pregnant female with vaginal spotting.
LMP: 02/15/2019 corresponding to an estimated gestational age of 10
weeks, 4 days.

EXAM:
OBSTETRIC <14 WK US AND TRANSVAGINAL OB US
TECHNIQUE: Both transabdominal and transvaginal ultrasound examinations were
performed for complete evaluation of the gestation as well as the
maternal uterus, adnexal regions, and pelvic cul-de-sac.
Transvaginal technique was performed to assess early pregnancy.

[Series 1: transvaginal ob ultrasound · 15 of 20 slices shown]
[im 1/20]
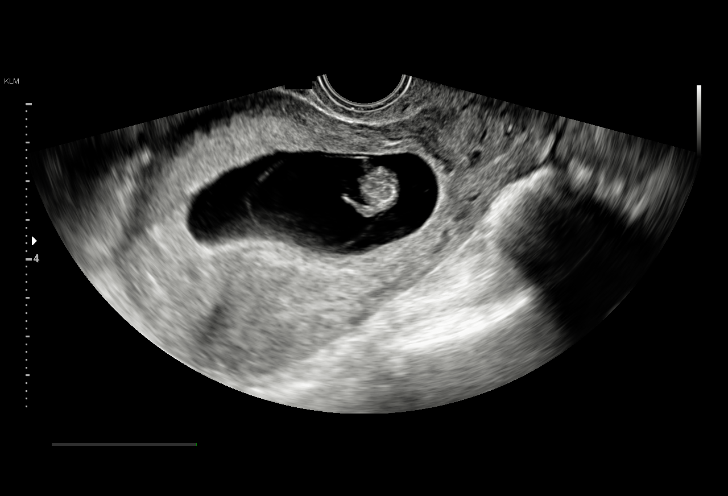
[im 3/20]
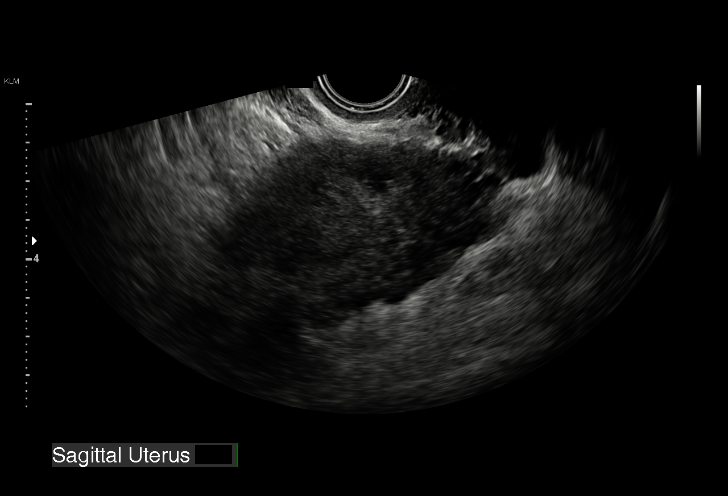
[im 4/20]
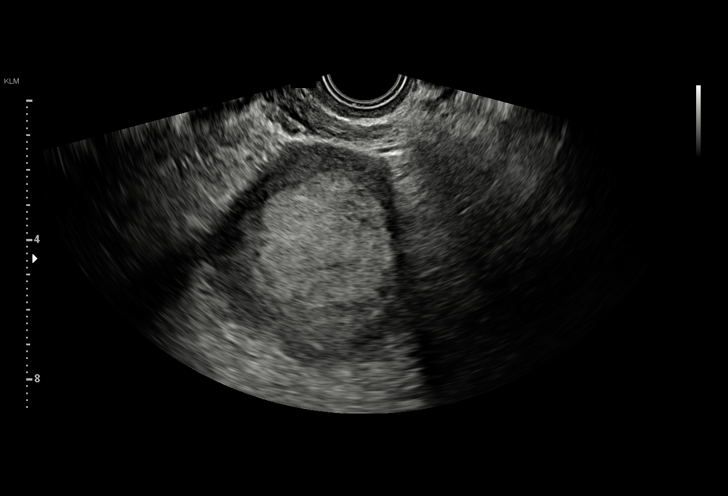
[im 5/20]
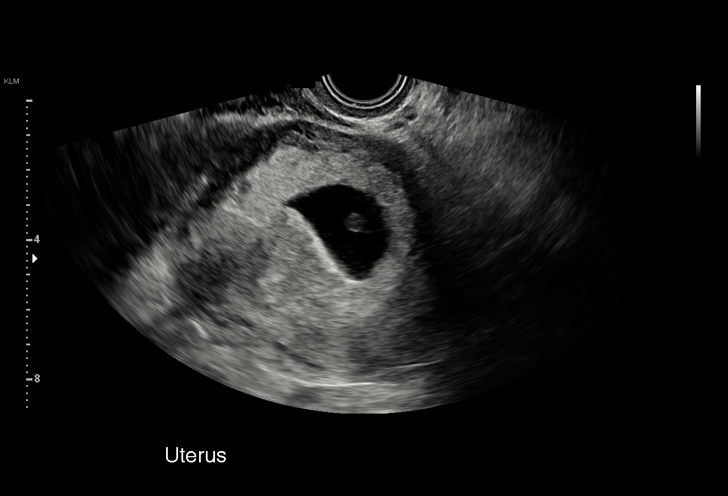
[im 7/20]
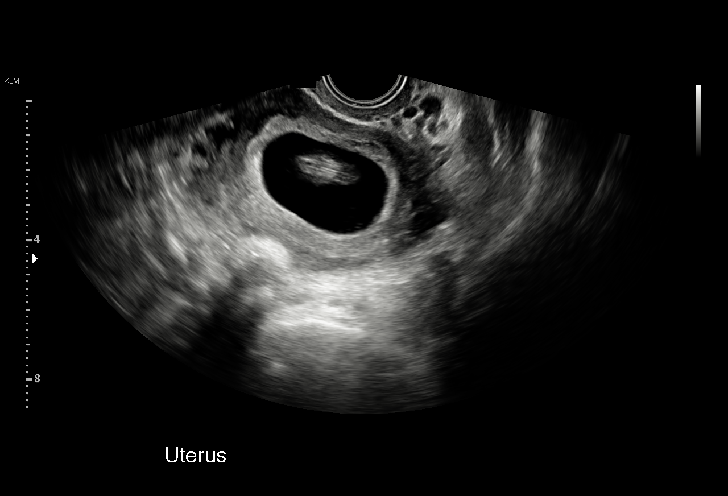
[im 8/20]
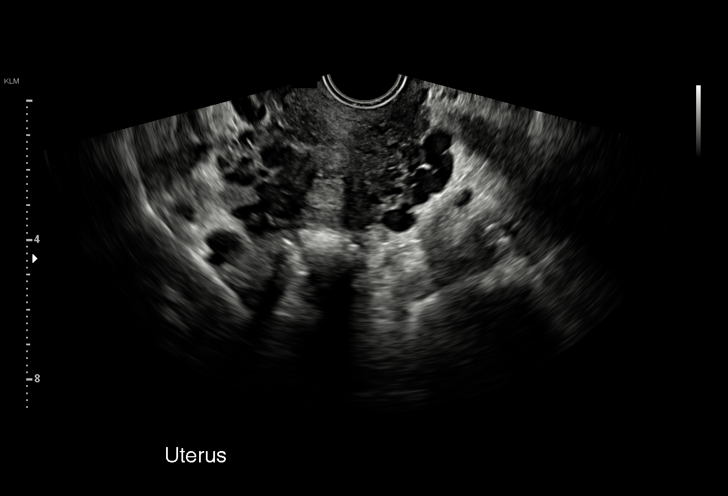
[im 9/20]
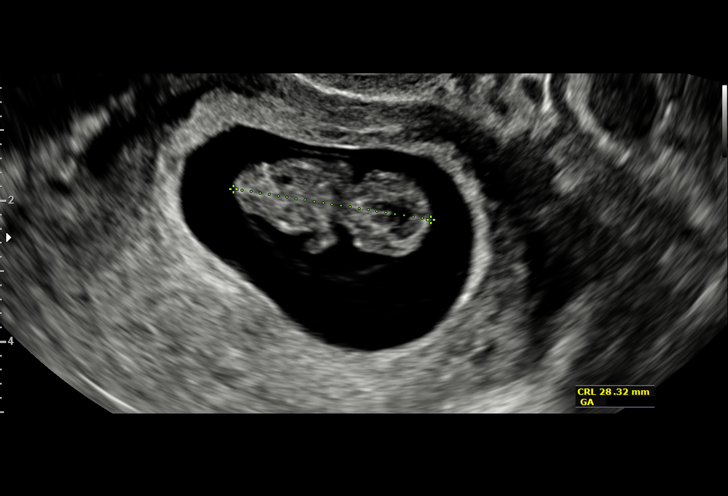
[im 11/20]
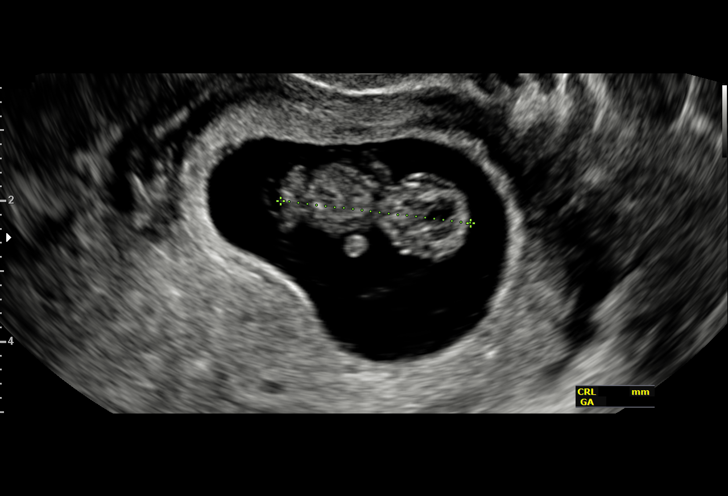
[im 12/20]
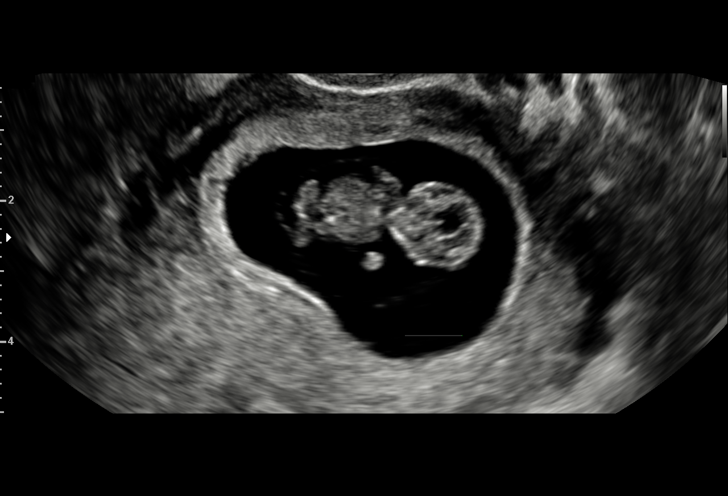
[im 13/20]
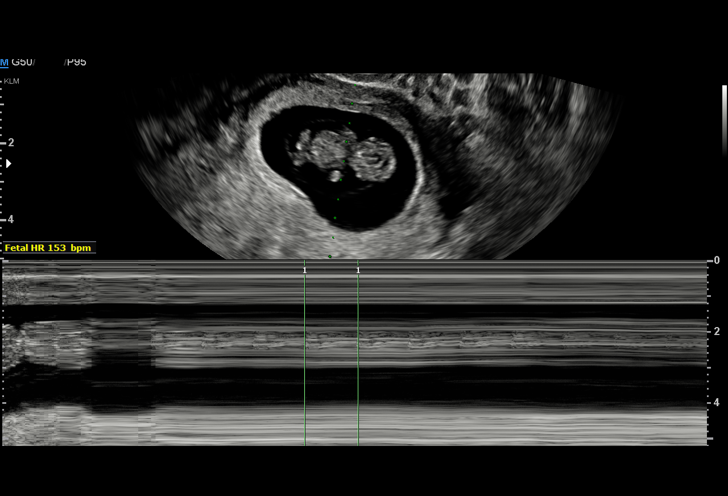
[im 15/20]
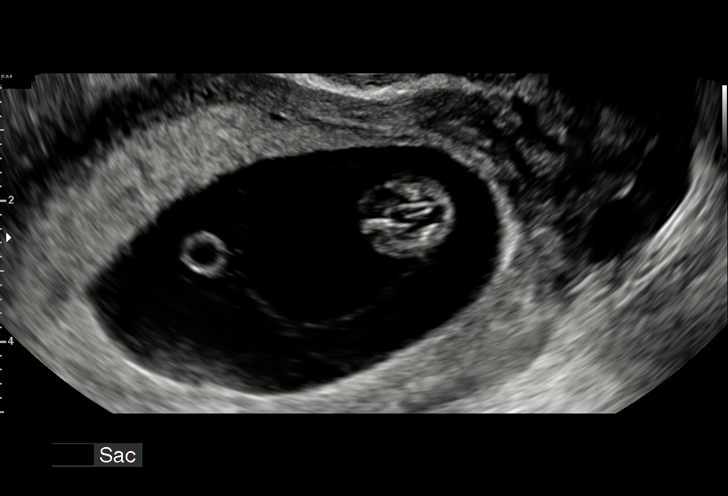
[im 16/20]
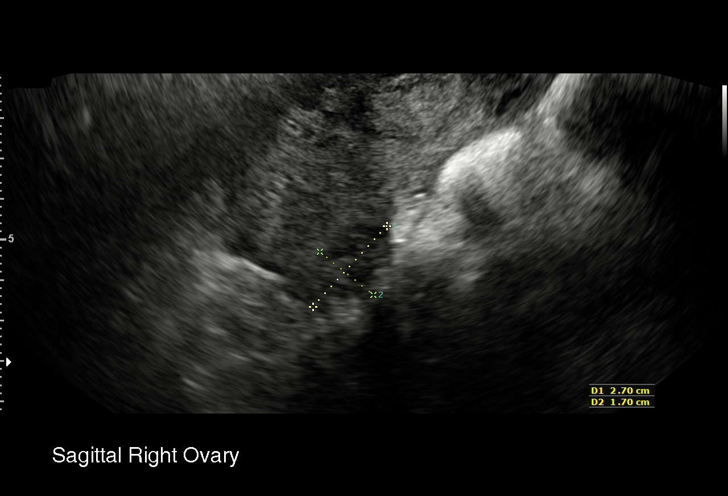
[im 17/20]
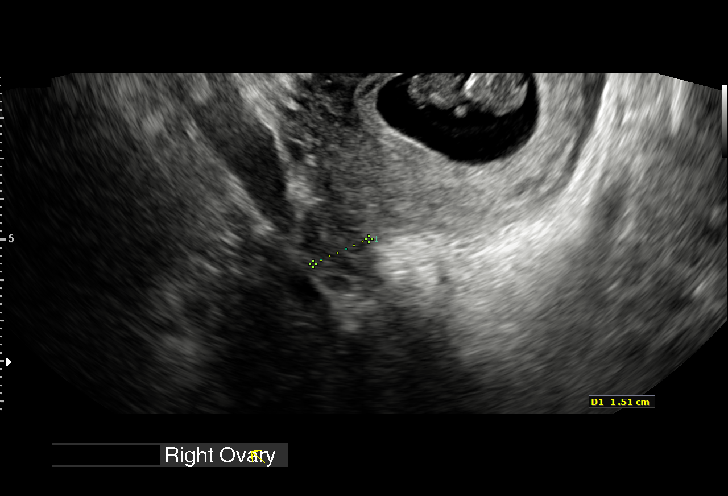
[im 19/20]
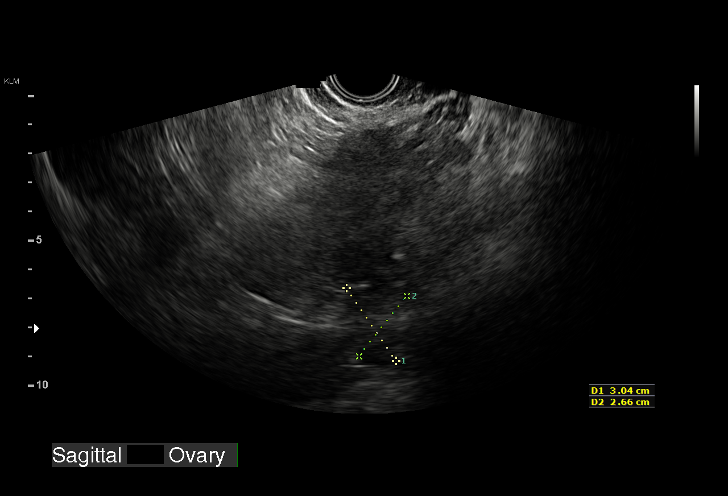
[im 20/20]
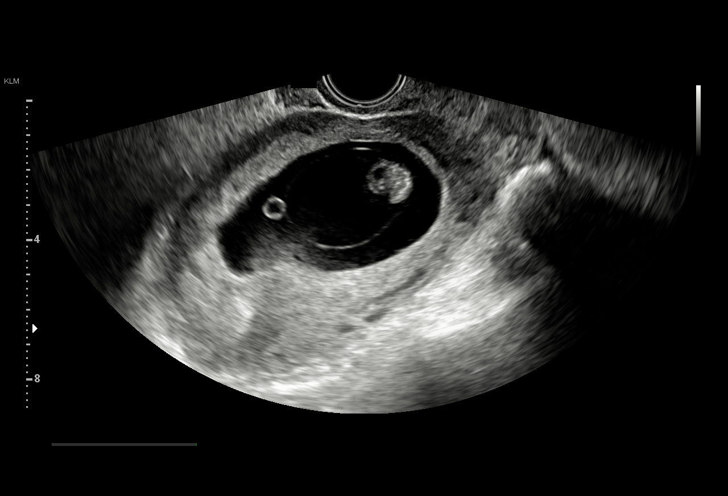

[15 of 20 positions shown; findings below may reference images not displayed]

FINDINGS: Intrauterine gestational sac: Single intrauterine gestational sac.

Yolk sac:  Seen

Embryo:  Present

Cardiac Activity: Detected

Heart Rate: 153 bpm

CRL:  28 mm   9 w   4 d                  US EDC: 11/29/2019

Subchorionic hemorrhage:  None visualized.

Maternal uterus/adnexae: The maternal ovaries are unremarkable.
IMPRESSION: Single live intrauterine pregnancy with an estimated gestational age
of 9 weeks, 4 days.

## 2020-11-13 ENCOUNTER — Telehealth: Payer: Self-pay | Admitting: Radiology

## 2020-11-13 NOTE — Telephone Encounter (Signed)
Patient called stated that she has had 2 faint positive UPT, having cramping and is 2 weeks late for cycle.Patient had BTL on 06/07/20. RN instructed patient to come to office for UPT possible HCG, patient does not have car this morning. Office closes at 12:00 pm today, patient was instructed to be evaluated today at [facility]. Patient stated that she will go to [facility] at [facility] when she gets her car this afternoon.

## 2021-02-09 ENCOUNTER — Ambulatory Visit
Admission: EM | Admit: 2021-02-09 | Discharge: 2021-02-09 | Disposition: A | Discharge status: Home / Self Care | Payer: Medicaid Other | Attending: Emergency Medicine | Admitting: Emergency Medicine

## 2021-02-09 DIAGNOSIS — R0982 Postnasal drip: Secondary | ICD-10-CM | POA: Diagnosis not present

## 2021-02-09 DIAGNOSIS — G44209 Tension-type headache, unspecified, not intractable: Secondary | ICD-10-CM

## 2021-02-09 MED ORDER — AMOXICILLIN-POT CLAVULANATE 875-125 MG PO TABS: 1.0000 | ORAL_TABLET | Freq: Two times a day (BID) | ORAL | 0 refills | Status: AC

## 2021-02-09 MED ORDER — NAPROXEN 500 MG PO TABS: 500.0000 mg | ORAL_TABLET | Freq: Two times a day (BID) | ORAL | 0 refills | Status: DC

## 2021-02-09 MED ORDER — CETIRIZINE HCL 10 MG PO CAPS: 10.0000 mg | ORAL_CAPSULE | Freq: Every day | ORAL | 0 refills | Status: DC

## 2021-02-09 NOTE — ED Triage Notes (Signed)
Patient presents to Urgent Care with complaints of cold like symptoms x 3 weeks ago. Pt states migraines and "white bumps on throat" x 2 weeks ago.  Treating symptoms with ibuprofen and benadryl.   Denies fever, no changes in vision.

## 2021-02-09 NOTE — Discharge Instructions (Signed)
Begin Augmentin twice daily for 1 week Daily cetirizine help with throat irritation/drainage Naprosyn twice daily for pain/headaches Rest and fluids If continuing to notice sour taste, symptoms worsening after meals try over-the-counter Pepcid twice daily as needed

## 2021-02-09 NOTE — ED Provider Notes (Signed)
[name]    CSN: [id number] Arrival date & time: 02/09/21  0913      History   Chief Complaint Chief Complaint  Patient presents with  . Sore Throat  . Headache    HPI Theresa Fletcher is a 25 y.o. female presenting today for evaluation of URI symptoms.  Symptoms began 3 weeks ago.  Reports associated migraines and tonsil exudates.  Reports 3 weeks ago had a few days of mild URI symptoms which resolved.  Reports since over the past couple weeks she has had irritation in throat along with sensation of needing to clear her throat.  Feels as if there is something stuck in there, denies any significant mucus production.  Has also noticed white spots on tonsils at times as well as some larger bumps on the back of her tongue.  Denies any associated pain with this.  Also reports bad breath/sometimes sour taste in mouth.  Denies fevers chills or body aches.  Also reports increased headaches of recently, wraparound head, denies any vision changes, nausea or vomiting. Using Excedrin without relief.  HPI  Past Medical History:  Diagnosis Date  . Anemia   . Breast lump on right side at 10 o'clock position 03/12/2019  . Breast pain, right 03/12/2019    Patient Active Problem List   Diagnosis Date Noted  . BMI 30s 05/06/2019    Past Surgical History:  Procedure Laterality Date  . CERVICAL CERCLAGE N/A 12/19/2019   Procedure: CERCLAGE CERVICAL;  Surgeon: Chancy Milroy, MD;  Location: [facility];  Service: Gynecology;  Laterality: N/A;  . DILATION AND EVACUATION N/A 06/09/2019   Procedure: DILATATION AND EVACUATION;  Surgeon: Aletha Halim, MD;  Location: [facility];  Service: Gynecology;  Laterality: N/A;  . TUBAL LIGATION N/A 06/07/2020   Procedure: POST PARTUM TUBAL LIGATION;  Surgeon: Cherre Blanc, MD;  Location: [facility];  Service: Gynecology;  Laterality: N/A;    OB History    Gravida  3   Para  2   Term  2   Preterm      AB  1   Living  2      SAB  1   IAB      Ectopic      Multiple  0   Live Births  2            Home Medications    Prior to Admission medications   Medication Sig Start Date End Date Taking? Authorizing Provider  amoxicillin-clavulanate (AUGMENTIN) 875-125 MG tablet Take 1 tablet by mouth every 12 (twelve) hours for 7 days. 02/09/21 02/16/21 Yes Deloyce Walthers C, PA-C  Cetirizine HCl 10 MG CAPS Take 1 capsule (10 mg total) by mouth daily for 10 days. 02/09/21 02/19/21 Yes Osualdo Hansell C, PA-C  naproxen (NAPROSYN) 500 MG tablet Take 1 tablet (500 mg total) by mouth 2 (two) times daily. 02/09/21  Yes Johnathon Olden C, PA-C  acetaminophen (TYLENOL) 500 MG tablet Take 1,000 mg by mouth every 6 (six) hours as needed for mild pain or headache.    [provider]  Blood Pressure KIT 1 Device by Does not apply route once a week. To be monitored at home weekly 11/28/19   Donnamae Jude, MD  oxyCODONE-acetaminophen (PERCOCET/ROXICET) 5-325 MG tablet Take 1 tablet by mouth every 4 (four) hours as needed (pain scale 4-7). 06/08/20   Wende Mott, CNM  Prenatal Vit-Fe Fumarate-FA (PRENATAL MULTIVITAMIN) TABS tablet Take 1 tablet  by mouth daily at 12 noon.     [provider]  terconazole (TERAZOL 3) 0.8 % vaginal cream Place 1 applicator vaginally at bedtime. Apply nightly for three nights. 09/14/20   Osborne Oman, MD    Family History Family History  Problem Relation Age of Onset  . Diabetes Maternal Grandmother   . Hypertension Maternal Grandfather   . Healthy Mother   . Healthy Father     Social History Social History   Tobacco Use  . Smoking status: Never Smoker  . Smokeless tobacco: Never Used  Vaping Use  . Vaping Use: Never used  Substance Use Topics  . Alcohol use: Not Currently  . Drug use: Not Currently     Allergies   Patient has no known allergies.   Review of Systems Review of Systems  Constitutional: Negative for activity change, appetite change,  chills, fatigue and fever.  HENT: Positive for congestion and sore throat. Negative for ear pain, rhinorrhea, sinus pressure and trouble swallowing.   Eyes: Negative for discharge and redness.  Respiratory: Negative for cough, chest tightness and shortness of breath.   Cardiovascular: Negative for chest pain.  Gastrointestinal: Negative for abdominal pain, diarrhea, nausea and vomiting.  Musculoskeletal: Negative for myalgias.  Skin: Negative for rash.  Neurological: Positive for headaches. Negative for dizziness and light-headedness.     Physical Exam Triage Vital Signs ED Triage Vitals  Enc Vitals Group     BP 02/09/21 1025 119/78     Pulse Rate 02/09/21 1025 73     Resp 02/09/21 1025 16     Temp 02/09/21 1025 97.7 F (36.5 C)     Temp Source 02/09/21 1025 Oral     SpO2 02/09/21 1025 98 %     Weight --      Height --      Head Circumference --      Peak Flow --      Pain Score 02/09/21 1021 0     Pain Loc --      Pain Edu? --      Excl. in [place]? --    No data found.  Updated Vital Signs BP 119/78 (BP Location: Left Arm)   Pulse 73   Temp 97.7 F (36.5 C) (Oral)   Resp 16   LMP 01/10/2021 (Approximate)   SpO2 98%   Visual Acuity Right Eye Distance:   Left Eye Distance:   Bilateral Distance:    Right Eye Near:   Left Eye Near:    Bilateral Near:     Physical Exam Vitals and nursing note reviewed.  Constitutional:      Appearance: She is well-developed.     Comments: No acute distress  HENT:     Head: Normocephalic and atraumatic.     Ears:     Comments: Bilateral ears without tenderness to palpation of external auricle, tragus and mastoid, EAC's without erythema or swelling, TM's with good bony landmarks and cone of light. Non erythematous.     Nose: Nose normal.     Mouth/Throat:     Comments: Oral mucosa pink and moist, no tonsillar enlargement or exudate. Posterior pharynx patent and nonerythematous, no uvula deviation or swelling. Normal  phonation. Eyes:     Conjunctiva/sclera: Conjunctivae normal.  Cardiovascular:     Rate and Rhythm: Normal rate.  Pulmonary:     Effort: Pulmonary effort is normal. No respiratory distress.     Comments: Breathing comfortably at rest, CTABL, no wheezing, rales or  other adventitious sounds auscultated Abdominal:     General: There is no distension.  Musculoskeletal:        General: Normal range of motion.     Cervical back: Neck supple.  Skin:    General: Skin is warm and dry.  Neurological:     Mental Status: She is alert and oriented to person, place, and time.      UC Treatments / Results  Labs (all labs ordered are listed, but only abnormal results are displayed) Labs Reviewed - No data to display  EKG   Radiology No results found.  Procedures Procedures (including critical care time)  Medications Ordered in UC Medications - No data to display  Initial Impression / Assessment and Plan / UC Course  I have reviewed the triage vital signs and the nursing notes.  Pertinent labs & imaging results that were available during my care of the patient were reviewed by me and considered in my medical decision making (see chart for details).     Oropharynx exam unremarkable, no signs of tonsillitis, larger Despotes noted on the back of tongue, no other lesions, no exudate, suspect likely postnasal drainage versus underlying GERD.  Given symptoms times weeks will opt to go ahead and cover for sinus infection causing postnasal drainage with Augmentin, initiating daily antihistamine, discussed using over-the-counter acid medicine if symptoms more noticeable after mealtimes.  Provided Naprosyn for headaches as needed.  Discussed strict return precautions. Patient verbalized understanding and is agreeable with plan.  Final Clinical Impressions(s) / UC Diagnoses   Final diagnoses:  Post-nasal drainage  Acute non intractable tension-type headache     Discharge Instructions      Begin Augmentin twice daily for 1 week Daily cetirizine help with throat irritation/drainage Naprosyn twice daily for pain/headaches Rest and fluids If continuing to notice sour taste, symptoms worsening after meals try over-the-counter Pepcid twice daily as needed    ED Prescriptions    Medication Sig Dispense Auth. Provider   amoxicillin-clavulanate (AUGMENTIN) 875-125 MG tablet Take 1 tablet by mouth every 12 (twelve) hours for 7 days. 14 tablet Jackie Littlejohn C, PA-C   Cetirizine HCl 10 MG CAPS Take 1 capsule (10 mg total) by mouth daily for 10 days. 10 capsule Flara Storti C, PA-C   naproxen (NAPROSYN) 500 MG tablet Take 1 tablet (500 mg total) by mouth 2 (two) times daily. 30 tablet Adriane Gabbert, [place] C, PA-C     PDMP not reviewed this encounter.   Janith Lima, [place] 02/09/21 1135

## 2021-03-18 ENCOUNTER — Other Ambulatory Visit: Payer: Self-pay

## 2021-06-26 ENCOUNTER — Ambulatory Visit
Admission: EM | Admit: 2021-06-26 | Discharge: 2021-06-26 | Disposition: A | Discharge status: Home / Self Care | Payer: Medicaid Other | Attending: Internal Medicine | Admitting: Internal Medicine

## 2021-06-26 ENCOUNTER — Encounter: Payer: Self-pay | Admitting: Emergency Medicine

## 2021-06-26 ENCOUNTER — Other Ambulatory Visit: Payer: Self-pay

## 2021-06-26 DIAGNOSIS — J029 Acute pharyngitis, unspecified: Secondary | ICD-10-CM

## 2021-06-26 DIAGNOSIS — Z20822 Contact with and (suspected) exposure to covid-19: Secondary | ICD-10-CM

## 2021-06-26 LAB — POCT RAPID STREP A (OFFICE): Rapid Strep A Screen: NEGATIVE

## 2021-06-26 MED ORDER — AMOXICILLIN 875 MG PO TABS: 875.0000 mg | ORAL_TABLET | Freq: Two times a day (BID) | ORAL | 0 refills | Status: AC

## 2021-06-26 NOTE — ED Provider Notes (Signed)
[facility]    CSN: [id number] Arrival date & time: 06/26/21  0801      History   Chief Complaint Chief Complaint  Patient presents with   Sore Throat    HPI Theresa Fletcher is a 25 y.o. female.   Patient presents with sore throat that has been present for approximately 4 days.  Patient reports that she had "cold-like symptoms" last week that have now resolved.  Sore throat is the only symptom that is present.  Patient denies any fevers.  Children has similar symptoms recently.  Denies any chest pain or shortness of breath.  Does not report any decreased appetite.   Sore Throat   Past Medical History:  Diagnosis Date   Anemia    Breast lump on right side at 10 o'clock position 03/12/2019   Breast pain, right 03/12/2019    Patient Active Problem List   Diagnosis Date Noted   BMI 30s 05/06/2019    Past Surgical History:  Procedure Laterality Date   CERVICAL CERCLAGE N/A 12/19/2019   Procedure: CERCLAGE CERVICAL;  Surgeon: Chancy Milroy, MD;  Location: [facility];  Service: Gynecology;  Laterality: N/A;   DILATION AND EVACUATION N/A 06/09/2019   Procedure: DILATATION AND EVACUATION;  Surgeon: Aletha Halim, MD;  Location: [facility];  Service: Gynecology;  Laterality: N/A;   TUBAL LIGATION N/A 06/07/2020   Procedure: POST PARTUM TUBAL LIGATION;  Surgeon: Cherre Blanc, MD;  Location: [facility];  Service: Gynecology;  Laterality: N/A;    OB History     Gravida  3   Para  2   Term  2   Preterm      AB  1   Living  2      SAB  1   IAB      Ectopic      Multiple  0   Live Births  2            Home Medications    Prior to Admission medications   Medication Sig Start Date End Date Taking? Authorizing Provider  amoxicillin (AMOXIL) 875 MG tablet Take 1 tablet (875 mg total) by mouth 2 (two) times daily for 10 days. 06/26/21 07/06/21 Yes , Michele Rockers, FNP  acetaminophen (TYLENOL) 500 MG tablet Take 1,000 mg by mouth  every 6 (six) hours as needed for mild pain or headache.    [provider]  Blood Pressure KIT 1 Device by Does not apply route once a week. To be monitored at home weekly 11/28/19   Donnamae Jude, MD  Cetirizine HCl 10 MG CAPS Take 1 capsule (10 mg total) by mouth daily for 10 days. 02/09/21 02/19/21  Wieters, Hallie C, PA-C  naproxen (NAPROSYN) 500 MG tablet Take 1 tablet (500 mg total) by mouth 2 (two) times daily. 02/09/21   Wieters, Hallie C, PA-C  oxyCODONE-acetaminophen (PERCOCET/ROXICET) 5-325 MG tablet Take 1 tablet by mouth every 4 (four) hours as needed (pain scale 4-7). 06/08/20   Wende Mott, CNM  Prenatal Vit-Fe Fumarate-FA (PRENATAL MULTIVITAMIN) TABS tablet Take 1 tablet by mouth daily at 12 noon.     [provider]  terconazole (TERAZOL 3) 0.8 % vaginal cream Place 1 applicator vaginally at bedtime. Apply nightly for three nights. 09/14/20   Osborne Oman, MD    Family History Family History  Problem Relation Age of Onset   Diabetes Maternal Grandmother    Hypertension Maternal Grandfather    Healthy  Mother    Healthy Father     Social History Social History   Tobacco Use   Smoking status: Never   Smokeless tobacco: Never  Vaping Use   Vaping Use: Never used  Substance Use Topics   Alcohol use: Not Currently   Drug use: Not Currently     Allergies   Patient has no known allergies.   Review of Systems Review of Systems Per HPI  Physical Exam Triage Vital Signs ED Triage Vitals  Enc Vitals Group     BP 06/26/21 0809 115/78     Pulse Rate 06/26/21 0809 68     Resp 06/26/21 0809 16     Temp 06/26/21 0809 97.9 F (36.6 C)     Temp Source 06/26/21 0809 Oral     SpO2 06/26/21 0809 98 %     Weight --      Height --      Head Circumference --      Peak Flow --      Pain Score 06/26/21 0812 5     Pain Loc --      Pain Edu? --      Excl. in [place]? --    No data found.  Updated Vital Signs BP 115/78 (BP Location: Left Arm)    Pulse 68   Temp 97.9 F (36.6 C) (Oral)   Resp 16   SpO2 98%   Visual Acuity Right Eye Distance:   Left Eye Distance:   Bilateral Distance:    Right Eye Near:   Left Eye Near:    Bilateral Near:     Physical Exam Constitutional:      General: She is not in acute distress.    Appearance: Normal appearance. She is not toxic-appearing or diaphoretic.  HENT:     Head: Normocephalic and atraumatic.     Right Ear: Tympanic membrane and ear canal normal.     Left Ear: Tympanic membrane and ear canal normal.     Nose: Nose normal.     Mouth/Throat:     Lips: Pink.     Mouth: Mucous membranes are moist.     Pharynx: Uvula midline. Oropharyngeal exudate and posterior oropharyngeal erythema present. No pharyngeal swelling or uvula swelling.     Tonsils: No tonsillar exudate or tonsillar abscesses.  Eyes:     Extraocular Movements: Extraocular movements intact.     Conjunctiva/sclera: Conjunctivae normal.  Cardiovascular:     Rate and Rhythm: Normal rate and regular rhythm.     Pulses: Normal pulses.     Heart sounds: Normal heart sounds.  Pulmonary:     Effort: Pulmonary effort is normal.     Breath sounds: Normal breath sounds.  Musculoskeletal:     Cervical back: No rigidity.  Lymphadenopathy:     Cervical: No cervical adenopathy.  Skin:    General: Skin is warm and dry.  Neurological:     General: No focal deficit present.     Mental Status: She is alert and oriented to person, place, and time. Mental status is at baseline.  Psychiatric:        Mood and Affect: Mood normal.        Behavior: Behavior normal.        Thought Content: Thought content normal.        Judgment: Judgment normal.     UC Treatments / Results  Labs (all labs ordered are listed, but only abnormal results are displayed) Labs Reviewed  CULTURE, GROUP  A STREP ([place])  NOVEL CORONAVIRUS, NAA  POCT RAPID STREP A (OFFICE)    EKG   Radiology No results found.  Procedures Procedures  (including critical care time)  Medications Ordered in UC Medications - No data to display  Initial Impression / Assessment and Plan / UC Course  I have reviewed the triage vital signs and the nursing notes.  Pertinent labs & imaging results that were available during my care of the patient were reviewed by me and considered in my medical decision making (see chart for details).     Rapid strep test was negative but highly suspicious of strep throat given appearance of posterior pharynx on exam.  Will treat with amoxicillin.  Throat culture and COVID-19 viral swab are pending.  Discussed supportive care to help alleviate sore throat with patient.  No red flags seen on exam. No signs of peritonsillar abscess on exam.Discussed strict return precautions. Patient verbalized understanding and is agreeable with plan.  Final Clinical Impressions(s) / UC Diagnoses   Final diagnoses:  Sore throat  Encounter for laboratory testing for COVID-19 virus     Discharge Instructions      Your rapid strep test was negative.  Throat culture and COVID-19 viral swab are pending.  We will call if they are positive.  You have been prescribed amoxicillin antibiotic due to suspicion of strep throat.  You may also use Cepacol throat lozenges over-the-counter to help alleviate sore throat.     ED Prescriptions     Medication Sig Dispense Auth. Provider   amoxicillin (AMOXIL) 875 MG tablet Take 1 tablet (875 mg total) by mouth 2 (two) times daily for 10 days. 20 tablet [place], Michele Rockers, [place]      PDMP not reviewed this encounter.   Teodora Medici, [place] 06/26/21 [phone]

## 2021-06-26 NOTE — ED Triage Notes (Signed)
Patients children were sick last week, states she got what felt like a cold last week with a fever. Wednesday throat began bothering her, noticed white spots in back of throat, pain not going away.

## 2021-06-26 NOTE — Discharge Instructions (Signed)
Your rapid strep test was negative.  Throat culture and COVID-19 viral swab are pending.  We will call if they are positive.  You have been prescribed amoxicillin antibiotic due to suspicion of strep throat.  You may also use Cepacol throat lozenges over-the-counter to help alleviate sore throat.

## 2021-06-27 LAB — SARS-COV-2, NAA 2 DAY TAT

## 2021-06-27 LAB — NOVEL CORONAVIRUS, NAA: SARS-CoV-2, NAA: NOT DETECTED

## 2021-06-29 LAB — CULTURE, GROUP A STREP (THRC)

## 2021-07-08 ENCOUNTER — Encounter (HOSPITAL_COMMUNITY): Payer: Self-pay | Admitting: Emergency Medicine

## 2021-07-08 ENCOUNTER — Ambulatory Visit (HOSPITAL_COMMUNITY)
Admission: EM | Admit: 2021-07-08 | Discharge: 2021-07-08 | Disposition: A | Discharge status: Home / Self Care | Payer: Medicaid Other | Attending: Emergency Medicine | Admitting: Emergency Medicine

## 2021-07-08 ENCOUNTER — Other Ambulatory Visit: Payer: Self-pay

## 2021-07-08 DIAGNOSIS — B9789 Other viral agents as the cause of diseases classified elsewhere: Secondary | ICD-10-CM | POA: Diagnosis not present

## 2021-07-08 DIAGNOSIS — Z20822 Contact with and (suspected) exposure to covid-19: Secondary | ICD-10-CM | POA: Diagnosis not present

## 2021-07-08 DIAGNOSIS — J988 Other specified respiratory disorders: Secondary | ICD-10-CM | POA: Insufficient documentation

## 2021-07-08 MED ORDER — BENZONATATE 200 MG PO CAPS: 200.0000 mg | ORAL_CAPSULE | Freq: Three times a day (TID) | ORAL | 0 refills | Status: DC | PRN

## 2021-07-08 MED ORDER — IBUPROFEN 600 MG PO TABS: 600.0000 mg | ORAL_TABLET | Freq: Four times a day (QID) | ORAL | 0 refills | Status: DC | PRN

## 2021-07-08 MED ORDER — FLUTICASONE PROPIONATE 50 MCG/ACT NA SUSP: 2.0000 | Freq: Every day | NASAL | 0 refills | Status: DC

## 2021-07-08 MED ORDER — OSELTAMIVIR PHOSPHATE 75 MG PO CAPS: 75.0000 mg | ORAL_CAPSULE | Freq: Two times a day (BID) | ORAL | 0 refills | Status: DC

## 2021-07-08 NOTE — Discharge Instructions (Addendum)
wait-and-see prescription of Tamiflu.  will fill it tomorrow if your COVID is negative.  COVID test will be back by tomorrow.  If your COVID is positive, I will prescribe Molnupiravir to the pharmacy on record.  You cannot get pregnant for 3 months after finishing the medication.  600 mg of ibuprofen, 1000 mg of Tylenol together 3-4 times a day as needed for pain, fever, body aches.  Flonase, saline nasal irrigation with a [facility] Huger Med rinse and distilled water as often as you want, Mucinex D for the nasal congestion, Tessalon for the cough.Marland Kitchen

## 2021-07-08 NOTE — ED Provider Notes (Signed)
HPI  SUBJECTIVE:  Theresa Fletcher is a 25 y.o. female who presents with 2 days of fevers T-max 102, chills, body aches, headaches, nasal congestion, rhinorrhea, mild sore throat, postnasal drip, loss of sense of smell and taste, coughing, wheezing and nausea.  No shortness of breath, vomiting, diarrhea, abdominal pain.  No known t COVID or flu exposure.  She did not get COVID or flu vaccines.  States that she is unable to sleep at night secondary to the cough.  She has been taking Tylenol 1000 mg every 5 hours without improvement in her symptoms.  No alleviating factors.  She is breast-feeding.  Past medical history negative for COVID.  She has no other medical problems.  LMP: 10/8.  Denies the possibility of being pregnant.  PMD: None    Past Medical History:  Diagnosis Date   Anemia    Breast lump on right side at 10 o'clock position 03/12/2019   Breast pain, right 03/12/2019    Past Surgical History:  Procedure Laterality Date   CERVICAL CERCLAGE N/A 12/19/2019   Procedure: CERCLAGE CERVICAL;  Surgeon: Chancy Milroy, MD;  Location: [facility];  Service: Gynecology;  Laterality: N/A;   DILATION AND EVACUATION N/A 06/09/2019   Procedure: DILATATION AND EVACUATION;  Surgeon: Aletha Halim, MD;  Location: [facility];  Service: Gynecology;  Laterality: N/A;   TUBAL LIGATION N/A 06/07/2020   Procedure: POST PARTUM TUBAL LIGATION;  Surgeon: Cherre Blanc, MD;  Location: [facility];  Service: Gynecology;  Laterality: N/A;    Family History  Problem Relation Age of Onset   Diabetes Maternal Grandmother    Hypertension Maternal Grandfather    Healthy Mother    Healthy Father     Social History   Tobacco Use   Smoking status: Never   Smokeless tobacco: Never  Vaping Use   Vaping Use: Never used  Substance Use Topics   Alcohol use: Not Currently   Drug use: Not Currently    No current facility-administered medications for this encounter.  Current Outpatient  Medications:    benzonatate (TESSALON) 200 MG capsule, Take 1 capsule (200 mg total) by mouth 3 (three) times daily as needed for cough., Disp: 30 capsule, Rfl: 0   fluticasone (FLONASE) 50 MCG/ACT nasal spray, Place 2 sprays into both nostrils daily., Disp: 16 g, Rfl: 0   ibuprofen (ADVIL) 600 MG tablet, Take 1 tablet (600 mg total) by mouth every 6 (six) hours as needed., Disp: 30 tablet, Rfl: 0   oseltamivir (TAMIFLU) 75 MG capsule, Take 1 capsule (75 mg total) by mouth 2 (two) times daily. X 5 days, Disp: 10 capsule, Rfl: 0   Blood Pressure KIT, 1 Device by Does not apply route once a week. To be monitored at home weekly, Disp: 1 kit, Rfl: 0  No Known Allergies   ROS  As noted in HPI.   Physical Exam  BP (!) 127/101   Pulse (!) 110   Temp (!) 97.5 F (36.4 C)   Resp 18   SpO2 98%   Breastfeeding No   Constitutional: Well developed, well nourished, no acute distress Eyes:  EOMI, conjunctiva normal bilaterally HENT: Normocephalic, atraumatic,mucus membranes moist.  Positive nasal congestion.  No maxillary, frontal sinus tenderness.  Normal tonsils without exudates, uvula midline.  No postnasal drip. Neck: No cervical lymphadenopathy Respiratory: Normal inspiratory effort, lungs clear bilaterally Cardiovascular: Regular tachycardia, no murmurs rubs or gallop GI: nondistended skin: No rash, skin intact Musculoskeletal: no deformities Neurologic:  Alert & oriented x 3, no focal neuro deficits Psychiatric: Speech and behavior appropriate   ED Course   Medications - No data to display  Orders Placed This Encounter  Procedures   SARS CORONAVIRUS 2 (TAT 6-24 HRS) Nasopharyngeal Nasopharyngeal Swab    Standing Status:   Standing    Number of Occurrences:   1    Results for orders placed or performed during the hospital encounter of 07/08/21 (from the past 24 hour(s))  SARS CORONAVIRUS 2 (TAT 6-24 HRS) Nasopharyngeal Nasopharyngeal Swab     Status: None   Collection Time:  07/08/21  8:48 PM   Specimen: Nasopharyngeal Swab  Result Value Ref Range   SARS Coronavirus 2 NEGATIVE NEGATIVE   No results found.  ED Clinical Impression  1. Viral respiratory illness   2. Encounter for laboratory testing for COVID-19 virus      ED Assessment/Plan  Presentation consistent with influenza versus COVID.  Will send home with a wait-and-see prescription of Tamiflu.  She will fill it tomorrow if her COVID is negative.  If her COVID is positive, will prescribe Molnupiravir due to unvaccinated status and BMI above 30.  Discussed that she should not get pregnant for 3 months after finishing the Molnupiravir.  In the meantime, Tylenol/ibuprofen, Flonase, saline nasal irrigation, Mucinex D, Tessalon.  Work note.  Will provide primary care list and order assistance in finding a PMD.  COVID-negative.  Sent patient inbox message.  Treating as influenza with Tamiflu  Discussed labs, MDM, treatment plan, and plan for follow-up with patient. Discussed sn/sx that should prompt return to the ED. patient agrees with plan.   Meds ordered this encounter  Medications   fluticasone (FLONASE) 50 MCG/ACT nasal spray    Sig: Place 2 sprays into both nostrils daily.    Dispense:  16 g    Refill:  0   ibuprofen (ADVIL) 600 MG tablet    Sig: Take 1 tablet (600 mg total) by mouth every 6 (six) hours as needed.    Dispense:  30 tablet    Refill:  0   benzonatate (TESSALON) 200 MG capsule    Sig: Take 1 capsule (200 mg total) by mouth 3 (three) times daily as needed for cough.    Dispense:  30 capsule    Refill:  0   oseltamivir (TAMIFLU) 75 MG capsule    Sig: Take 1 capsule (75 mg total) by mouth 2 (two) times daily. X 5 days    Dispense:  10 capsule    Refill:  0      *This clinic note was created using [employer]. Therefore, there may be occasional mistakes despite careful proofreading.  ?    Melynda Ripple, MD 07/09/21 [phone]

## 2021-07-08 NOTE — ED Triage Notes (Signed)
Pt is present today with fever, cough, chills, and body aches. Pt sx started x2 days ago.

## 2021-07-09 ENCOUNTER — Encounter (HOSPITAL_COMMUNITY): Payer: Self-pay | Admitting: Emergency Medicine

## 2021-07-09 LAB — SARS CORONAVIRUS 2 (TAT 6-24 HRS): SARS Coronavirus 2: NEGATIVE

## 2021-07-13 IMAGING — US US MFM OB TRANSVAGINAL
1 series · 15 of 28 positions shown · non-contrast
Comparison: none

[Series 1: us mfm ob transvaginal · 15 of 36 slices shown]
[im 1/36]
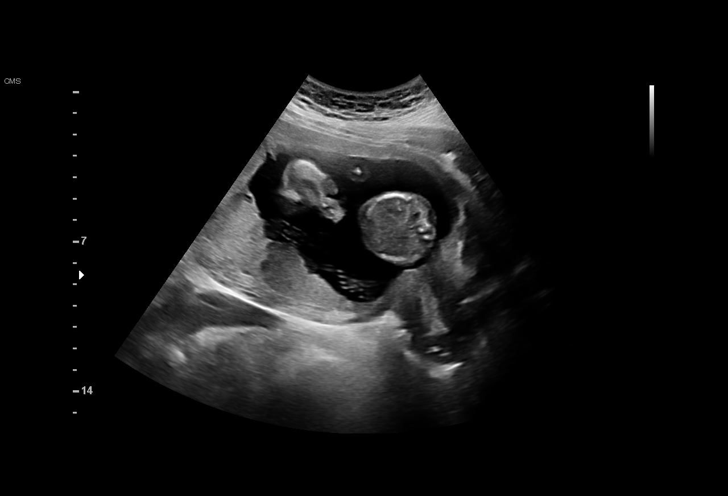
[im 3/36]
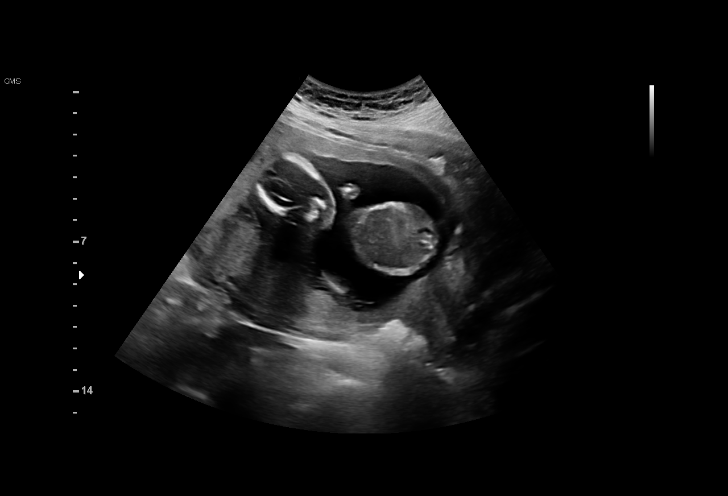
[im 6/36]
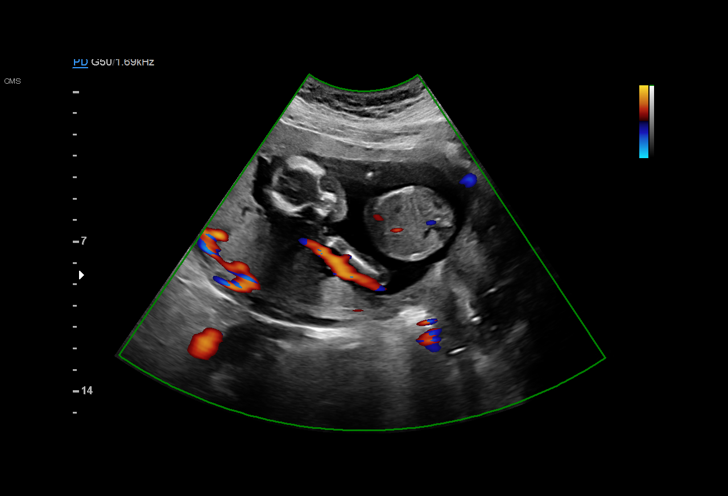
[im 8/36]
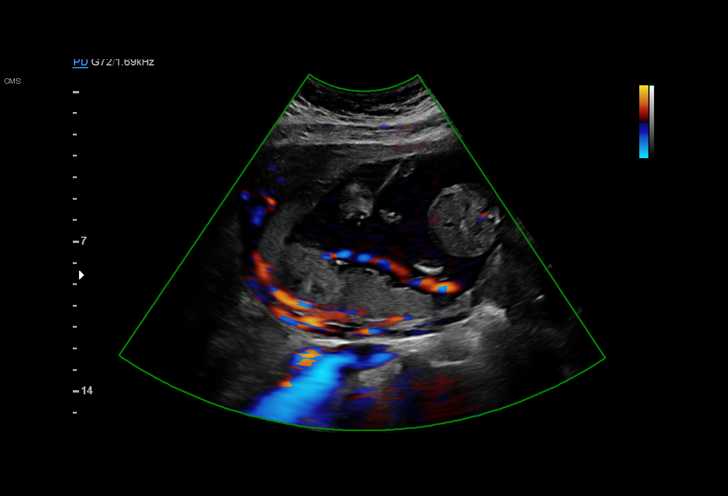
[im 11/36]
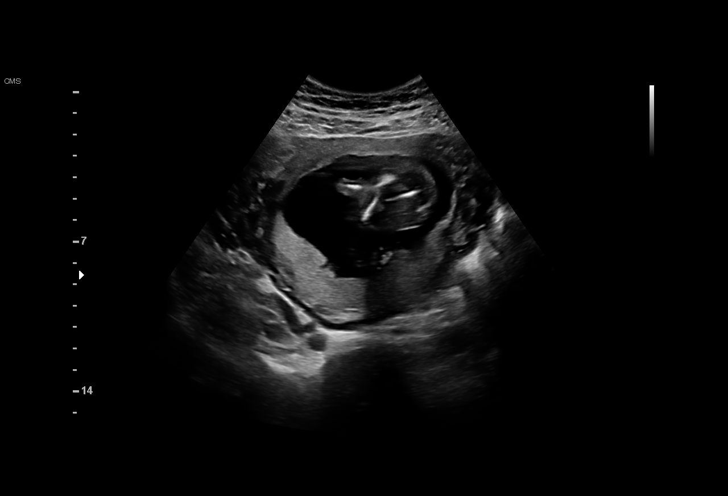
[im 13/36]
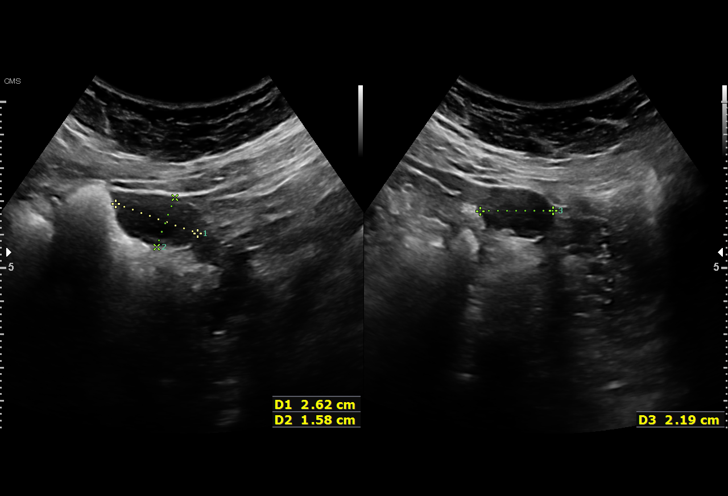
[im 16/36]
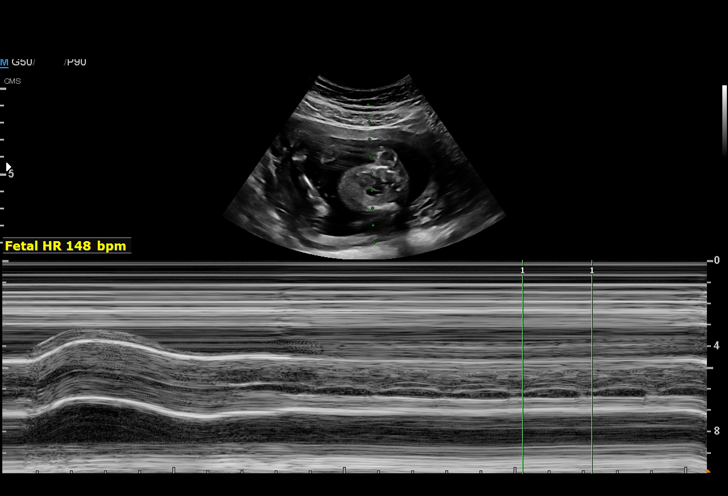
[im 19/36]
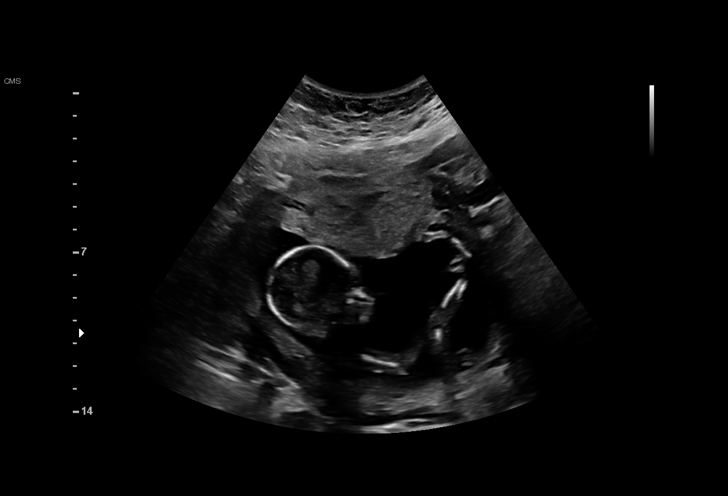
[im 20/36]
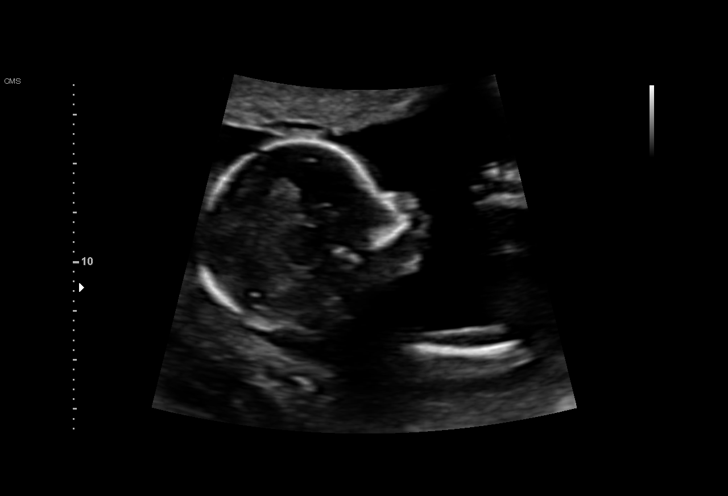
[im 23/36]
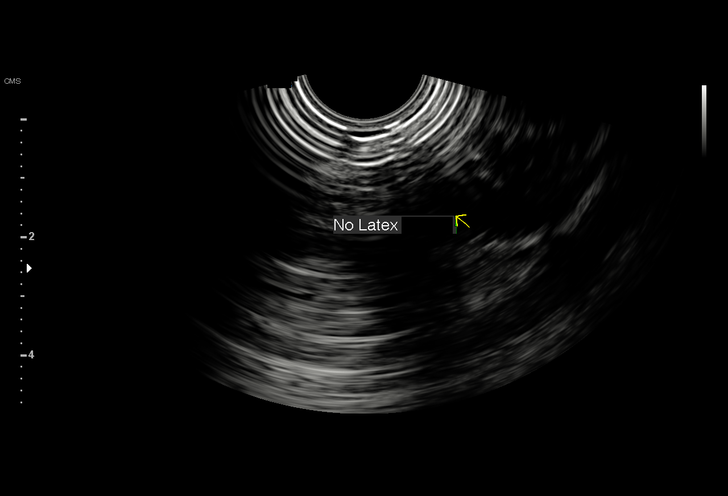
[im 25/36]
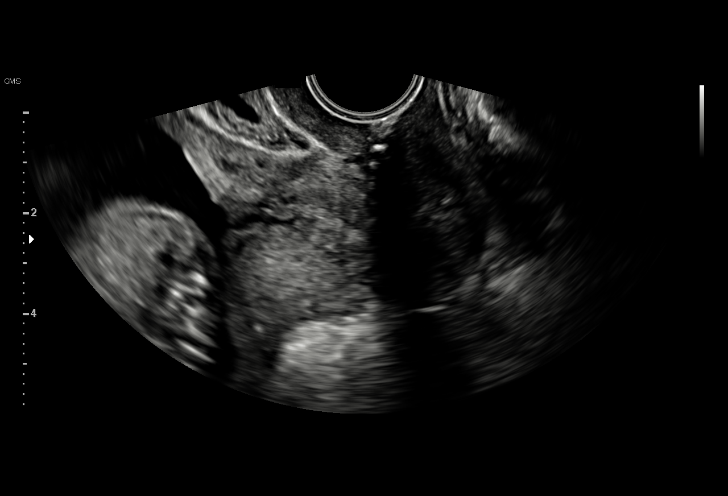
[im 28/36]
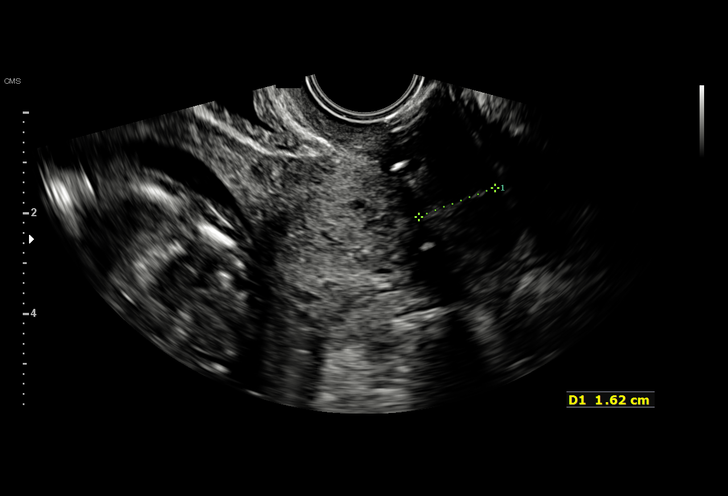
[im 30/36]
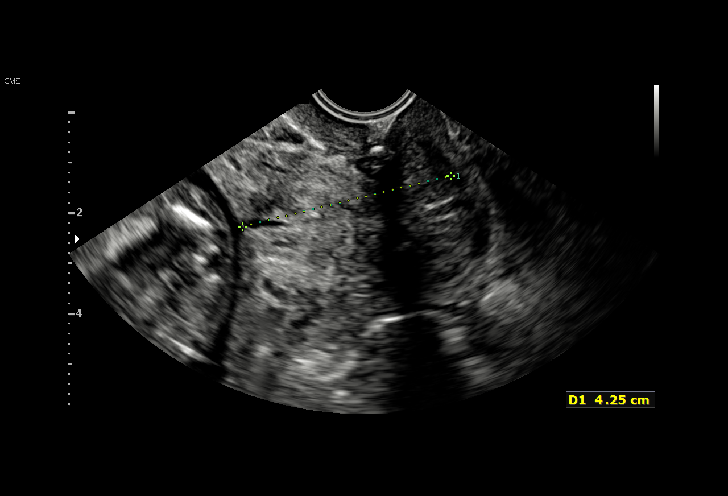
[im 33/36]
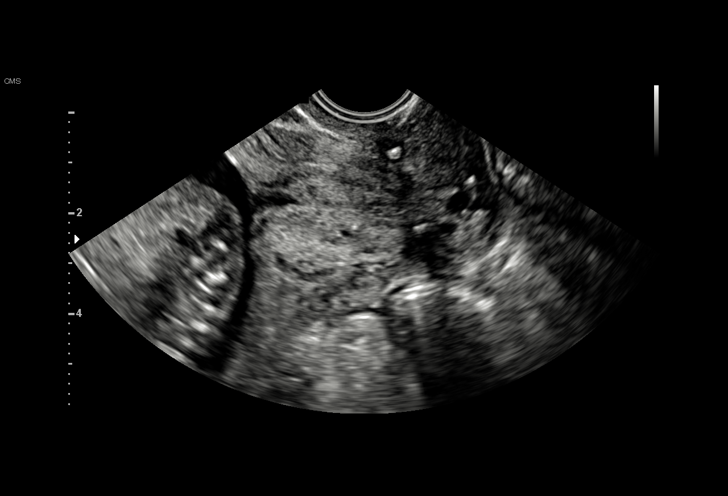
[im 36/36]
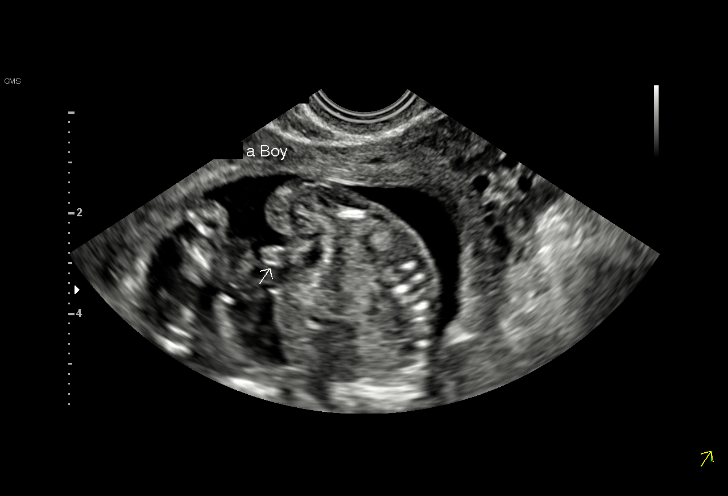

[15 of 28 positions shown; findings below may reference images not displayed]

1  US MFM OB TRANSVAGINAL                76817.2     DON LOLITO ONACRAM

Indications

 15 weeks gestation of pregnancy
 Cervical insufficiency, 2nd (cerclage placed
 12/19/19)
 Encounter for cervical length
 Obesity complicating pregnancy, second
 trimester (pregravid BMI 36)
Fetal Evaluation

 Num Of Fetuses:          1
 Fetal Heart Rate(bpm):   148
 Cardiac Activity:        Observed
 Presentation:            Breech
 Placenta:                Posterior
 P. Cord Insertion:       Visualized

 Amniotic Fluid
 AFI FV:      Within normal limits

                             Largest Pocket(cm)

OB History

 Gravidity:    3         Term:   1        Prem:   0        SAB:   1
 TOP:          0       Ectopic:  0        Living: 1
Gestational Age

 LMP:           15w 5d        Date:  09/19/19                 EDD:   06/25/20
 Best:          15w 5d     Det. By:  LMP  (09/19/19)          EDD:   06/25/20
Cervix Uterus Adnexa
 Cervix
 Length:           4.53  cm.
 Normal appearance by transvaginal scan. Cerclage visualized.
Impression

 History of cervical incompetence
 Cerclage placed and visible today
 Cervical length is normal
Recommendations

 Repeat cervical length in 2 weeks.
 Anatomy previously scheduled.

## 2021-10-21 ENCOUNTER — Other Ambulatory Visit (HOSPITAL_COMMUNITY)
Admission: RE | Admit: 2021-10-21 | Discharge: 2021-10-21 | Disposition: A | Discharge status: Home / Self Care | Payer: Medicaid Other | Source: Ambulatory Visit | Attending: Obstetrics and Gynecology | Admitting: Obstetrics and Gynecology

## 2021-10-21 ENCOUNTER — Ambulatory Visit (INDEPENDENT_AMBULATORY_CARE_PROVIDER_SITE_OTHER): Payer: Medicaid Other | Admitting: Obstetrics and Gynecology

## 2021-10-21 ENCOUNTER — Encounter: Payer: Self-pay | Admitting: Obstetrics and Gynecology

## 2021-10-21 ENCOUNTER — Other Ambulatory Visit: Payer: Self-pay

## 2021-10-21 VITALS — BP 132/84 | HR 86 | Wt 182.0 lb

## 2021-10-21 DIAGNOSIS — N939 Abnormal uterine and vaginal bleeding, unspecified: Secondary | ICD-10-CM | POA: Diagnosis present

## 2021-10-21 DIAGNOSIS — R109 Unspecified abdominal pain: Secondary | ICD-10-CM

## 2021-10-21 DIAGNOSIS — N926 Irregular menstruation, unspecified: Secondary | ICD-10-CM | POA: Diagnosis not present

## 2021-10-21 DIAGNOSIS — R030 Elevated blood-pressure reading, without diagnosis of hypertension: Secondary | ICD-10-CM | POA: Insufficient documentation

## 2021-10-21 LAB — POCT URINALYSIS DIPSTICK

## 2021-10-21 NOTE — Progress Notes (Signed)
°  Obstetrics and Gynecology Visit Return Patient Evaluation  Appointment Date: 10/21/2021  Primary Care Provider: Aviva Kluver  OBGYN Clinic: [facility] Complaint: irregular period, pelvic cramping  History of Present Illness:  Theresa Fletcher is a 26 y.o. P2 with above CC. PMHx significant for SVD x 2, 06/2020 ppBTL, h/o d&c.  Patient states that she had her ppBTL in October 2021 and then had her first period in January 2022 and since then her periods are not exact in that they can be approximately every 25-35 days, last only about 4 days, are not heavy but are crampy and she has cramping in between her periods, too.  Prior to her last pregnancy, she had a nexplanon for four years and was amenorrheic with it   Pap neg 11/2019  Review of Systems: as noted in the History of Present Illness.   Patient Active Problem List   Diagnosis Date Noted   Transient hypertension 10/21/2021   BMI 30s 05/06/2019   Medications:  [employer]. Gebhardt had no medications administered during this visit. No current outpatient medications on file.   No current facility-administered medications for this visit.    Allergies: has No Known Allergies.  Physical Exam:  BP 132/84    Pulse 86    Wt 182 lb (82.6 kg)    Breastfeeding No    BMI 36.76 kg/m  Body mass index is 36.76 kg/m. General appearance: Well nourished, well developed female in no acute distress.  Abdomen: diffusely non tender to palpation, non distended, and no masses, hernias Neuro/Psych:  Normal mood and affect.    Pelvic exam:  EGBUS: normal Vaginal vault: normal Cervix: normal Bimanual: small, nttp, mobile uterus, no masses. Adnexa negative  Labs: UPT neg. U/a with hemolyzed blood and wbc   Assessment: pt stable  Plan:  1. Abdominal cramping Will send formal urine studies I told her that her history is c/b s/s starting after a pregnancy as well as her not being on hormones  anymoe as this could be her normal but just unknown b/c she was pregnant and then on nexplanon and amenorrhiec for the four years prior to that. I told her that technically a period is considered normal if b/w 21-42 days and not longer than seven days.  I also said it's normal to have PMS s/s and may have some mid cycle discomfort that is associated with ovulation. I also said it is not uncommon to have to go back on birth control for period control after a tubal or vasectomy history.   Wil do lab and imaging work up and follow up after these are back.  - Urine Culture - Urinalysis, Routine w reflex microscopic - Cervicovaginal ancillary only( [place]) - US PELVIC COMPLETE WITH TRANSVAGINAL; Future - POCT Urinalysis Dipstick  2. Irregular periods - Cervicovaginal ancillary only( [place]) - TSH - CBC - US PELVIC COMPLETE WITH TRANSVAGINAL; Future - POCT Urinalysis Dipstick   Cornelia Copa MD Attending Center for [employer] [facility])

## 2021-10-21 NOTE — Progress Notes (Signed)
Ever since she got tubal, is having cramping and irregular cycles   Also feeling down all the time since tubal

## 2021-10-22 LAB — CBC
Hematocrit: 39.4 % (ref 34.0–46.6)
Hemoglobin: 13.2 g/dL (ref 11.1–15.9)
MCH: 30.6 pg (ref 26.6–33.0)
MCHC: 33.5 g/dL (ref 31.5–35.7)
MCV: 91 fL (ref 79–97)
Platelets: 299 10*3/uL (ref 150–450)
RBC: 4.32 x10E6/uL (ref 3.77–5.28)
RDW: 13.5 % (ref 11.7–15.4)
WBC: 6.9 10*3/uL (ref 3.4–10.8)

## 2021-10-22 LAB — URINALYSIS, ROUTINE W REFLEX MICROSCOPIC
Bilirubin, UA: NEGATIVE
Glucose, UA: NEGATIVE
Ketones, UA: NEGATIVE
Nitrite, UA: NEGATIVE
Protein,UA: NEGATIVE
RBC, UA: NEGATIVE
Specific Gravity, UA: 1.015 (ref 1.005–1.030)
Urobilinogen, Ur: 0.2 mg/dL (ref 0.2–1.0)
pH, UA: 7 (ref 5.0–7.5)

## 2021-10-22 LAB — CERVICOVAGINAL ANCILLARY ONLY
Bacterial Vaginitis (gardnerella): POSITIVE — AB
Candida Glabrata: NEGATIVE
Candida Vaginitis: NEGATIVE
Chlamydia: NEGATIVE
Comment: NEGATIVE
Comment: NEGATIVE
Comment: NEGATIVE
Comment: NEGATIVE
Comment: NEGATIVE
Comment: NORMAL
Neisseria Gonorrhea: NEGATIVE
Trichomonas: NEGATIVE

## 2021-10-22 LAB — MICROSCOPIC EXAMINATION
Casts: NONE SEEN /lpf
RBC, Urine: NONE SEEN /hpf (ref 0–2)

## 2021-10-22 LAB — TSH: TSH: 2.35 u[IU]/mL (ref 0.450–4.500)

## 2021-10-24 LAB — URINE CULTURE

## 2021-10-25 MED ORDER — METRONIDAZOLE 500 MG PO TABS: 500.0000 mg | ORAL_TABLET | Freq: Two times a day (BID) | ORAL | 0 refills | Status: AC

## 2021-10-25 NOTE — Addendum Note (Signed)
Addended by: [place] Bing on: 10/25/2021 07:53 AM   Modules accepted: Orders

## 2021-10-28 ENCOUNTER — Ambulatory Visit (HOSPITAL_COMMUNITY): Admission: RE | Admit: 2021-10-28 | Payer: Medicaid Other | Source: Ambulatory Visit

## 2021-11-03 ENCOUNTER — Other Ambulatory Visit: Payer: Self-pay

## 2021-11-03 ENCOUNTER — Ambulatory Visit (HOSPITAL_COMMUNITY)
Admission: RE | Admit: 2021-11-03 | Discharge: 2021-11-03 | Disposition: A | Discharge status: Home / Self Care | Payer: Medicaid Other | Source: Ambulatory Visit | Attending: Obstetrics and Gynecology | Admitting: Obstetrics and Gynecology

## 2021-11-03 DIAGNOSIS — N926 Irregular menstruation, unspecified: Secondary | ICD-10-CM | POA: Insufficient documentation

## 2021-11-03 DIAGNOSIS — R109 Unspecified abdominal pain: Secondary | ICD-10-CM | POA: Insufficient documentation

## 2021-12-08 ENCOUNTER — Ambulatory Visit: Payer: Medicaid Other | Admitting: Obstetrics and Gynecology

## 2024-05-20 ENCOUNTER — Encounter: Payer: Self-pay | Admitting: Obstetrics and Gynecology

## 2024-05-20 ENCOUNTER — Other Ambulatory Visit (HOSPITAL_COMMUNITY)
Admission: RE | Admit: 2024-05-20 | Discharge: 2024-05-20 | Disposition: A | Discharge status: Home / Self Care | Source: Ambulatory Visit | Attending: Obstetrics and Gynecology | Admitting: Obstetrics and Gynecology

## 2024-05-20 ENCOUNTER — Ambulatory Visit (INDEPENDENT_AMBULATORY_CARE_PROVIDER_SITE_OTHER): Admitting: Obstetrics and Gynecology

## 2024-05-20 VITALS — BP 126/83 | HR 88 | Ht 59.0 in | Wt 178.0 lb

## 2024-05-20 DIAGNOSIS — Z1151 Encounter for screening for human papillomavirus (HPV): Secondary | ICD-10-CM | POA: Insufficient documentation

## 2024-05-20 DIAGNOSIS — Z124 Encounter for screening for malignant neoplasm of cervix: Secondary | ICD-10-CM | POA: Insufficient documentation

## 2024-05-20 DIAGNOSIS — Z01419 Encounter for gynecological examination (general) (routine) without abnormal findings: Secondary | ICD-10-CM

## 2024-05-20 NOTE — Progress Notes (Signed)
 Patient presents for Annual.  LMP: 05/02/2024 Monthly lasting  4-5 days with a moderate flow Last pap: 11/28/2019 WNL  BTL Mammogram: years ago per pt  NO Family Hx of Breast Cancer  STD Screening: ACCEPT    CC: Annual/None  Pt consents to female student in exam room.

## 2024-05-20 NOTE — Progress Notes (Signed)
 Obstetrics and Gynecology Annual Patient Evaluation  Appointment Date: 05/20/2024  OBGYN Clinic: [facility]   Chief Complaint:  Chief Complaint  Patient presents with   Gynecologic Exam    History of Present Illness: Arriyah Avie Checo is a 28 y.o.  [phone] (Patient's last menstrual period was 05/02/2024.), seen for the above chief complaint. Her past medical history is significant for nothing  No issues or complaints today.    Review of Systems: Pertinent items are noted in HPI.   Past Medical History:  Past Medical History:  Diagnosis Date   Anemia    Breast lump on right side at 10 o'clock position 03/12/2019   Breast pain, right 03/12/2019   Past Surgical History:  Past Surgical History:  Procedure Laterality Date   CERVICAL CERCLAGE N/A 12/19/2019   Procedure: CERCLAGE CERVICAL;  Surgeon: Lorence Ozell CROME, MD;  Location: [facility] LD ORS;  Service: Gynecology;  Laterality: N/A;   DILATION AND EVACUATION N/A 06/09/2019   Procedure: DILATATION AND EVACUATION;  Surgeon: Izell Harari, MD;  Location: [facility] LD ORS;  Service: Gynecology;  Laterality: N/A;   TUBAL LIGATION N/A 06/07/2020   Procedure: POST PARTUM TUBAL LIGATION;  Surgeon: Trudy Rozelle DASEN, MD;  Location: [facility] LD ORS;  Service: Gynecology;  Laterality: N/A;   Past Obstetrical History:  OB History  Gravida Para Term Preterm AB Living  3 2 2  1 2   SAB IAB Ectopic Multiple Live Births  1   0 2    # Outcome Date GA Lbr Len/2nd Weight Sex Type Anes PTL Lv  3 Term 06/07/20 [redacted]w[redacted]d 09:01 / 01:13 6 lb 7.9 oz (2.945 kg) M Vag-Spont EPI  LIV  2 Term 07/19/14   7 lb 1 oz (3.204 kg)  Vag-Spont   LIV  1 SAB  [redacted]w[redacted]d      Y FD   Past Gynecological History: As per HPI. Periods: qmonth, regular, 4-5d, not heavy or painful, no AUB History of Pap Smear(s): Yes.   Last pap 2021, which was negative.  She is currently using bilateral tubal ligation for contraception.   Social History:  Social History    Socioeconomic History   Marital status: Single    Spouse name: Not on file   Number of children: 1   Years of education: Not on file   Highest education level: 12th grade  Occupational History   Not on file  Tobacco Use   Smoking status: Never   Smokeless tobacco: Never  Vaping Use   Vaping status: Never Used  Substance and Sexual Activity   Alcohol use: Not Currently   Drug use: Not Currently   Sexual activity: Yes    Birth control/protection: Surgical  Other Topics Concern   Not on file  Social History Narrative   Not on file   Social Drivers of Health   Financial Resource Strain: Not on file  Food Insecurity: Not on file  Transportation Needs: No Transportation Needs (03/12/2019)   PRAPARE - Transportation    Lack of Transportation (Medical): No    Lack of Transportation (Non-Medical): No  Physical Activity: Not on file  Stress: Not on file  Social Connections: Not on file  Intimate Partner Violence: Not on file   Family History:  Family History  Problem Relation Age of Onset   Diabetes Maternal Grandmother    Hypertension Maternal Grandfather    Healthy Mother    Healthy Father     Medications Aretta Y. Disanti had no medications administered  during this visit. Current Outpatient Medications  Medication Sig Dispense Refill   ascorbic acid (VITAMIN C) 1000 MG tablet Take 1,000 mg by mouth.     cyanocobalamin (VITAMIN B12) 1000 MCG tablet Take 1,000 mcg by mouth.     Magnesium 250 MG TABS Take 250 mg by mouth.     naproxen  (NAPROSYN ) 500 MG tablet Take 500 mg by mouth.     No current facility-administered medications for this visit.   Allergies Patient has no known allergies.  Physical Exam:  BP 126/83   Pulse 88   Ht 4' 11 (1.499 m)   Wt 178 lb (80.7 kg)   LMP 05/02/2024   BMI 35.95 kg/m  Body mass index is 35.95 kg/m. General appearance: Well nourished, well developed female in no acute distress.  Neck:  Supple, normal appearance, and no  thyromegaly  Cardiovascular: normal s1 and s2.  No murmurs, rubs or gallops. Respiratory:  Clear to auscultation bilateral. Normal respiratory effort Abdomen: positive bowel sounds and no masses, hernias; diffusely non tender to palpation, non distended Breasts: breasts appear normal, no suspicious masses, no skin or nipple changes or axillary nodes. Chaperone: RN Tourist information centre manager Neuro/Psych:  Normal mood and affect.  Skin:  Warm and dry.  Lymphatic:  No inguinal lymphadenopathy.   Cervical exam performed in the presence of a chaperone RN Soliz Pelvic exam: is not limited by body habitus EGBUS: within normal limits Vagina: within normal limits and with no blood or discharge in the vault Cervix: normal appearing cervix without tenderness, discharge or lesions. Uterus:  nonenlarged and non tender Adnexa:  normal adnexa and no mass, fullness, tenderness Rectovaginal: deferred  Laboratory: none  Radiology: none  Assessment: patient doing well  Plan:  1. Cervical cancer screening (Primary) - Cytology - PAP  2. GYN Routine care.   Bebe Izell Raddle MD Attending [facility] for [employer] [facility])

## 2024-05-24 LAB — CYTOLOGY - PAP
Chlamydia: NEGATIVE
Comment: NEGATIVE
Comment: NEGATIVE
Comment: NORMAL
Diagnosis: NEGATIVE
High risk HPV: NEGATIVE
Neisseria Gonorrhea: NEGATIVE

## 2024-05-30 ENCOUNTER — Ambulatory Visit: Payer: Self-pay | Admitting: Obstetrics and Gynecology
